# Patient Record
Sex: Male | Born: 1980 | Hispanic: No | Marital: Married | Smoking: Never smoker
Health system: Southern US, Community
[De-identification: ages and names within clinical notes are randomized; demographics above are authoritative.]

## PROBLEM LIST (undated history)

## (undated) DIAGNOSIS — I2510 Atherosclerotic heart disease of native coronary artery without angina pectoris: Secondary | ICD-10-CM

## (undated) DIAGNOSIS — I10 Essential (primary) hypertension: Secondary | ICD-10-CM

## (undated) DIAGNOSIS — R7303 Prediabetes: Secondary | ICD-10-CM

## (undated) DIAGNOSIS — L4050 Arthropathic psoriasis, unspecified: Secondary | ICD-10-CM

## (undated) HISTORY — DX: Atherosclerotic heart disease of native coronary artery without angina pectoris: I25.10

## (undated) HISTORY — DX: Essential (primary) hypertension: I10

## (undated) HISTORY — DX: Prediabetes: R73.03

## (undated) HISTORY — DX: Arthropathic psoriasis, unspecified: L40.50

---

## 2020-05-02 ENCOUNTER — Ambulatory Visit: Payer: BC Managed Care – PPO | Admitting: Internal Medicine

## 2020-05-16 NOTE — Progress Notes (Addendum)
Office Visit Note  Patient: Tyler Matthews             Date of Birth: 06-08-1980           MRN: [id number]             PCP: Brynda Greathouse, MD Referring: Jearld Adjutant * Visit Date: 05/29/2020 Occupation: @GUAROCC @  Subjective:  Pain in multiple joints and psoriatic arthritis.   History of Present Illness: Tyler Matthews is a 40 y.o. male seen in consultation per request of his previous rheumatologist Dr. 20 from [facility].  According to the patient in 2014 he developed psoriasis on his scalp which was later diagnosed with psoriasis by the dermatologist and was treated with topical agents.  He moved to [place] in 2015 where he developed pain and swelling in his left ankle in 2017 he was initially evaluated by orthopedic surgeon and a sports medicine doctor and then was referred to a rheumatologist.  He has been under care of rheumatologist since 2018.  He has been on methotrexate and folic acid combination.  He was on methotrexate 6 tablets/week and did quite well and the methotrexate dose was tapered to 4 tablets/week.  He stayed on the same dosage for a while.  In April 2021 he moved to [facility] and was getting his prescriptions from his previous rheumatologist.  As his rheumatologist moved he has not had any medication for the last 2 months.  He states he recently drove to [facility] [place] and came back and developed right ankle Achilles tendinitis.  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  He has some discomfort in his left wrist joint off-and-on.  None of the other joints are swollen.  There is no history of nail pitting.  There is family history of psoriasis in his cousin and paternal uncle.  There is also possible psoriatic arthritis in his paternal uncle.  He has 2 children who are in good health and they were born by IVF.  The couple is not using any contraception at  this time.  Activities of Daily Living:  Patient reports morning stiffness for 0 minutes.   Patient Denies nocturnal pain.  Difficulty dressing/grooming: Denies Difficulty climbing stairs: Denies Difficulty getting out of chair: Denies Difficulty using hands for taps, buttons, cutlery, and/or writing: Denies  Review of Systems  Constitutional: Negative for fatigue.  HENT: Negative for mouth sores, mouth dryness and nose dryness.   Eyes: Negative for pain, itching and dryness.  Respiratory: Negative for shortness of breath and difficulty breathing.   Cardiovascular: Negative for chest pain and palpitations.  Gastrointestinal: Negative for blood in stool, constipation and diarrhea.  Endocrine: Negative for increased urination.  Genitourinary: Negative for difficulty urinating.  Musculoskeletal: Positive for arthralgias, joint pain and joint swelling. Negative for myalgias, morning stiffness, muscle tenderness and myalgias.  Skin: Negative for color change, rash and redness.  Allergic/Immunologic: Negative for susceptible to infections.  Neurological: Negative for dizziness, numbness, headaches, memory loss and weakness.  Hematological: Negative for bruising/bleeding tendency.  Psychiatric/Behavioral: Negative for confusion.    PMFS History:  Patient Active Problem List   Diagnosis Date Noted  . Psoriatic arthritis (HCC) 05/29/2020  . Psoriasis 05/29/2020    History reviewed. No pertinent past medical history.  Family History  Problem Relation Age of Onset  . Hypertension Mother   . Diabetes Father   .  Parkinson's disease Father   . Healthy Brother   . Healthy Brother   . Hypertension Brother   . Healthy Son   . Healthy Son    History reviewed. No pertinent surgical history. Social History   Social History Narrative  . Not on file   Immunization History  Administered Date(s) Administered  . Moderna Sars-Covid-2 Vaccination 04/21/2019, 05/21/2019, 02/10/2020      Objective: Vital Signs: BP (!) 180/105 (BP Location: Right Arm, Patient Position: Sitting, Cuff Size: Normal)   Pulse 71   Resp 14   Ht 5\' 8"  (1.727 m)   Wt 170 lb (77.1 kg)   BMI 25.85 kg/m    Physical Exam Vitals and nursing note reviewed.  Constitutional:      Appearance: He is well-developed.  HENT:     Head: Normocephalic and atraumatic.  Eyes:     Conjunctiva/sclera: Conjunctivae normal.     Pupils: Pupils are equal, round, and reactive to light.  Cardiovascular:     Rate and Rhythm: Normal rate and regular rhythm.     Heart sounds: Normal heart sounds.  Pulmonary:     Effort: Pulmonary effort is normal.     Breath sounds: Normal breath sounds.  Abdominal:     General: Bowel sounds are normal.     Palpations: Abdomen is soft.  Musculoskeletal:     Cervical back: Normal range of motion and neck supple.  Skin:    General: Skin is warm and dry.     Capillary Refill: Capillary refill takes less than 2 seconds.  Neurological:     Mental Status: He is alert and oriented to person, place, and time.  Psychiatric:        Behavior: Behavior normal.      Musculoskeletal Exam: C-spine thoracic and lumbar spine were in good range of motion.  He had no SI joint tenderness.  Shoulder joints, elbow joints, wrist joints, MCPs PIPs and DIPs with good range of motion with no synovitis.  He has some tenderness over his left wrist joint.  Hip joints, knee joints are in good range of motion.  He has warmth and swelling over his left ankle joint.  He had right Achilles tendinitis.  There was no tenderness over plantar fascia.  There was no tenderness over MTPs PIPs or DIPs.  CDAI Exam: CDAI Score: -- Patient Global: --; Provider Global: -- Swollen: --; Tender: -- Joint Exam 05/29/2020   No joint exam has been documented for this visit   There is currently no information documented on the homunculus. Go to the Rheumatology activity and complete the homunculus joint  exam.  Investigation: No additional findings.  Imaging: DG Chest 2 View  Result Date: 05/29/2020 CLINICAL DATA:  Patient on immunosuppressive therapy. EXAM: CHEST - 2 VIEW COMPARISON:  None. FINDINGS: The cardiac silhouette, mediastinal and hilar contours are normal. The lungs are clear. No pleural effusions. The bony thorax is intact. IMPRESSION: Normal chest x-ray. Electronically Signed   By: 05/31/2020 M.D.   On: 05/29/2020 12:24   XR Foot 2 Views Left  Result Date: 05/29/2020 No MTP narrowing was noted.  Second PIP narrowing was noted.  No intertarsal, tibiotalar or subtalar joint space narrowing was noted.  Posterior calcaneal spur was noted.  No erosive changes were noted. Impression: These findings are consistent with early osteoarthritic changes and calcaneal spur.  XR Foot 2 Views Right  Result Date: 05/29/2020 No MCP, PIP or DIP narrowing was noted.  No intertarsal, tibiotalar or  subtalar joint space narrowing was noted.  Small inferior and posterior calcaneal spurs were noted.  No erosive changes were noted. Impression: Unremarkable x-ray of the foot except for calcaneal spurs.  XR Hand 2 View Left  Result Date: 05/29/2020 No CMC, MCP, PIP or DIP narrowing was noted.  No intercarpal or radiocarpal joint space narrowing was noted.  No erosive changes were noted. Impression: Unremarkable x-ray of the hand.  XR Hand 2 View Right  Result Date: 05/29/2020 No CMC, MCP, PIP or DIP narrowing was noted.  No intercarpal or radiocarpal joint space narrowing was noted.  No erosive changes were noted. Impression: Unremarkable x-ray of the hand.   Recent Labs: No results found for: WBC, HGB, PLT, NA, K, CL, CO2, GLUCOSE, BUN, CREATININE, BILITOT, ALKPHOS, AST, ALT, PROT, ALBUMIN, CALCIUM, GFRAA, QFTBGOLD, QFTBGOLDPLUS  Speciality Comments: No specialty comments available.  Procedures:  No procedures performed Allergies: Patient has no allergy information on record.   Assessment /  Plan:     Visit Diagnoses: Psoriatic arthritis (HCC) - Dx in [place], Dr. Trisha Mangle in 2018 -he has been treated with methotrexate 6 tablets p.o. weekly with folic acid.  The methotrexate was reduced to 4 tablets p.o. weekly before he moved to [place].  He discontinued methotrexate about 2 months ago as he could not get any further refills.  He has been having pain and discomfort in his left wrist, right Achilles tendon and left ankle joint.  We had detailed discussion regarding different treatment options.  He has done really well on methotrexate in the past and wants to go on the same medication.  Indications side effects contraindications were discussed at length.  My plan is to start him on methotrexate 6 tablets p.o. weekly along with folic acid 1 mg p.o. daily once the lab results are available.  Once he starts methotrexate he will get labs in 2 weeks and then every 3 months to monitor for drug toxicity.  Plan: Sedimentation rate  Drug Counseling TB Gold: Pending Hepatitis panel: Pending  Chest-xray: Pending  Contraception: None.  History of infertility.  He had 2 children with IVF.  Use of contraception was discussed at length.  Alcohol use: Occasional beer  Patient was counseled on the purpose, proper use, and adverse effects of methotrexate including nausea, infection, and signs and symptoms of pneumonitis.  Reviewed instructions with patient to take methotrexate weekly along with folic acid daily.  Discussed the importance of frequent monitoring of kidney and liver function and blood counts, and provided patient with standing lab instructions.  Counseled patient to avoid NSAIDs and alcohol while on methotrexate.  Provided patient with educational materials on methotrexate and answered all questions.  Advised patient to get annual influenza vaccine and to get a pneumococcal vaccine if patient has not already had one.  Patient voiced understanding.  Patient consented to methotrexate use.  Will upload  into chart.    High risk medication use - MTX 15 mg weekly and folic acid 1 mg daily. insurance denied otezla in 2018 - Plan: CBC with Differential/Platelet, COMPLETE METABOLIC PANEL WITH GFR, Hepatitis B core antibody, IgM, Hepatitis B surface antigen, Hepatitis C antibody, HIV Antibody (routine testing w rflx), QuantiFERON-TB Gold Plus, Serum protein electrophoresis with reflex, IgG, IgA, IgM, DG Chest 2 View  Psoriasis - Dx in 2016 he states that psoriasis is limited to his scalp and responds to the topical agents.  He is currently does not have any active lesions.  Pain in both hands -he has  off-and-on discomfort in his hands.  He had tenderness on palpation over left wrist.  Plan: XR Hand 2 View Right, XR Hand 2 View Left.  X-ray of bilateral hands were unremarkable.  X-ray findings were discussed with the patient.  Pain in both feet -get swelling of his left ankle joint.  He had right Achilles tendinitis.  Plan: XR Foot 2 Views Right, XR Foot 2 Views Left, calcaneal spurs were noted.  X-ray findings were discussed with the patient.  Uric acid, Rheumatoid factor, Cyclic citrul peptide antibody, IgG  Elevated blood pressure reading-patient states that he was treated with metoprolol in the past by his PCP but since he has moved here he does not have a PCP.  He requested a referral to a PCP.  As his blood pressure reading was high today I will place him on amlodipine 5 mg p.o. daily.  Indications, side effects were discussed.  I also advised him to get his blood pressure monitored on a regular basis and keep some readings until his appointment with the PCP.  De Quervain's tenosynovitis, right-he developed symptoms 1 year ago and had complete resolution after cortisone injection by his rheumatologist.  Family history of psoriasis-paternal uncle and first cousin.  His paternal uncle also has psoriatic arthritis.   He is fully vaccinated against COVID-19.  Instructions regarding future booster was  placed.  Instructions regarding other vaccines was also placed.  Increased risk of heart disease with psoriatic arthritis was discussed and the instructions were placed in the AVS.  Orders: Orders Placed This Encounter  Procedures  . XR Hand 2 View Right  . XR Hand 2 View Left  . XR Foot 2 Views Right  . XR Foot 2 Views Left  . DG Chest 2 View  . CBC with Differential/Platelet  . COMPLETE METABOLIC PANEL WITH GFR  . Sedimentation rate  . Uric acid  . Rheumatoid factor  . Cyclic citrul peptide antibody, IgG  . Hepatitis B core antibody, IgM  . Hepatitis B surface antigen  . Hepatitis C antibody  . HIV Antibody (routine testing w rflx)  . QuantiFERON-TB Gold Plus  . Serum protein electrophoresis with reflex  . IgG, IgA, IgM  . [facility]   No orders of the defined types were placed in this encounter.     Follow-Up Instructions: Return for Psoriatic arthritis.   Pollyann SavoyShaili Dhruti Ghuman, MD  Note - This record has been created using Animal [employer]istDragon software.  Chart creation errors have been sought, but may not always  have been located. Such creation errors do not reflect on  the standard of medical care.

## 2020-05-29 ENCOUNTER — Other Ambulatory Visit: Payer: Self-pay

## 2020-05-29 ENCOUNTER — Ambulatory Visit (INDEPENDENT_AMBULATORY_CARE_PROVIDER_SITE_OTHER): Payer: BC Managed Care – PPO | Admitting: Rheumatology

## 2020-05-29 ENCOUNTER — Ambulatory Visit: Payer: Self-pay

## 2020-05-29 ENCOUNTER — Encounter: Payer: Self-pay | Admitting: Rheumatology

## 2020-05-29 ENCOUNTER — Ambulatory Visit (HOSPITAL_COMMUNITY)
Admission: RE | Admit: 2020-05-29 | Discharge: 2020-05-29 | Disposition: A | Discharge status: Home / Self Care | Payer: BC Managed Care – PPO | Source: Ambulatory Visit | Attending: Rheumatology | Admitting: Rheumatology

## 2020-05-29 ENCOUNTER — Telehealth: Payer: Self-pay

## 2020-05-29 VITALS — BP 180/105 | HR 71 | Resp 14 | Ht 68.0 in | Wt 170.0 lb

## 2020-05-29 DIAGNOSIS — L409 Psoriasis, unspecified: Secondary | ICD-10-CM | POA: Diagnosis not present

## 2020-05-29 DIAGNOSIS — M79672 Pain in left foot: Secondary | ICD-10-CM | POA: Diagnosis not present

## 2020-05-29 DIAGNOSIS — M79671 Pain in right foot: Secondary | ICD-10-CM

## 2020-05-29 DIAGNOSIS — Z79899 Other long term (current) drug therapy: Secondary | ICD-10-CM | POA: Diagnosis present

## 2020-05-29 DIAGNOSIS — M79642 Pain in left hand: Secondary | ICD-10-CM

## 2020-05-29 DIAGNOSIS — Z872 Personal history of diseases of the skin and subcutaneous tissue: Secondary | ICD-10-CM

## 2020-05-29 DIAGNOSIS — M79641 Pain in right hand: Secondary | ICD-10-CM | POA: Diagnosis not present

## 2020-05-29 DIAGNOSIS — M654 Radial styloid tenosynovitis [de Quervain]: Secondary | ICD-10-CM

## 2020-05-29 DIAGNOSIS — L4050 Arthropathic psoriasis, unspecified: Secondary | ICD-10-CM

## 2020-05-29 DIAGNOSIS — R030 Elevated blood-pressure reading, without diagnosis of hypertension: Secondary | ICD-10-CM

## 2020-05-29 MED ORDER — AMLODIPINE BESYLATE 5 MG PO TABS: 5.0000 mg | ORAL_TABLET | Freq: Every day | ORAL | 0 refills | Status: DC

## 2020-05-29 NOTE — Telephone Encounter (Signed)
Methotrexate and folic acid prescriptions pending lab results, per Dr. Corliss Skains. Thanks!   Consent obtained and sent to the scan center.

## 2020-05-29 NOTE — Addendum Note (Signed)
Addended by: Alysia Penna C on: 05/29/2020 01:43 PM   Modules accepted: Orders

## 2020-05-29 NOTE — Patient Instructions (Signed)
Standing Labs We placed an order today for your standing lab work.   Please have your standing labs drawn in 2 weeks after starting methotrexate and then every 3 months  If possible, please have your labs drawn 2 weeks prior to your appointment so that the provider can discuss your results at your appointment.  We have open lab daily Monday through Thursday from 1:30-4:30 PM and Friday from 1:30-4:00 PM at the office of Dr. Pollyann Savoy, [facility].   Please be advised, all patients with office appointments requiring lab work will take precedents over walk-in lab work.  If possible, please come for your lab work on Monday and Friday afternoons, as you may experience shorter wait times. The office is located at [address], [place], [place] [address] No appointment is necessary.   Labs are drawn by [facility]. Please bring your co-pay at the time of your lab draw.  You may receive a bill from [facility] for your lab work.  If you wish to have your labs drawn at another location, please call the office 24 hours in advance to send orders.  If you have any questions regarding directions or hours of operation,  please call [phone].   As a reminder, please drink plenty of water prior to coming for your lab work. Thanks!     Methotrexate tablets What is this medicine? METHOTREXATE (METH oh TREX ate) is a chemotherapy drug used to treat cancer including breast cancer, leukemia, and lymphoma. This medicine can also be used to treat psoriasis and certain kinds of arthritis. This medicine may be used for other purposes; ask your health care provider or pharmacist if you have questions. COMMON BRAND NAME(S): Rheumatrex, Trexall What should I tell my health care provider before I take this medicine? They need to know if you have any of these conditions:  fluid in the stomach area or lungs  if you often drink alcohol  infection or immune system problems  kidney  disease or on hemodialysis  liver disease  low blood counts, like low white cell, platelet, or red cell counts  lung disease  radiation therapy  stomach ulcers  ulcerative colitis  an unusual or allergic reaction to methotrexate, other medicines, foods, dyes, or preservatives  pregnant or trying to get pregnant  breast-feeding How should I use this medicine? Take this medicine by mouth with a glass of water. Follow the directions on the prescription label. Take your medicine at regular intervals. Do not take it more often than directed. Do not stop taking except on your doctor's advice. Make sure you know why you are taking this medicine and how often you should take it. If this medicine is used for a condition that is not cancer, like arthritis or psoriasis, it should be taken weekly, NOT daily. Taking this medicine more often than directed can cause serious side effects, even death. Talk to your healthcare provider about safe handling and disposal of this medicine. You may need to take special precautions. Talk to your pediatrician regarding the use of this medicine in children. While this drug may be prescribed for selected conditions, precautions do apply. Overdosage: If you think you have taken too much of this medicine contact a poison control center or emergency room at once. NOTE: This medicine is only for you. Do not share this medicine with others. What if I miss a dose? If you miss a dose, talk with your doctor or health care professional. Do not take double  or extra doses. What may interact with this medicine? Do not take this medicine with any of the following medications:  acitretin This medicine may also interact with the following medication:  aspirin and aspirin-like medicines including salicylates  azathioprine  certain antibiotics like penicillins, tetracycline, and chloramphenicol  certain medicines that treat or prevent blood clots like warfarin, apixaban,  dabigatran, and rivaroxaban  certain medicines for stomach problems like esomeprazole, omeprazole, pantoprazole  cyclosporine  dapsone  diuretics  gold  hydroxychloroquine  live virus vaccines  medicines for infection like acyclovir, adefovir, amphotericin B, bacitracin, cidofovir, foscarnet, ganciclovir, gentamicin, pentamidine, vancomycin  mercaptopurine  NSAIDs, medicines for pain and inflammation, like ibuprofen or naproxen  other cytotoxic agents  pamidronate  pemetrexed  penicillamine  phenylbutazone  phenytoin  probenecid  pyrimethamine  retinoids such as isotretinoin and tretinoin  steroid medicines like prednisone or cortisone  sulfonamides like sulfasalazine and trimethoprim/sulfamethoxazole  theophylline  zoledronic acid This list may not describe all possible interactions. Give your health care provider a list of all the medicines, herbs, non-prescription drugs, or dietary supplements you use. Also tell them if you smoke, drink alcohol, or use illegal drugs. Some items may interact with your medicine. What should I watch for while using this medicine? Avoid alcoholic drinks. This medicine can make you more sensitive to the sun. Keep out of the sun. If you cannot avoid being in the sun, wear protective clothing and use sunscreen. Do not use sun lamps or tanning beds/booths. You may need blood work done while you are taking this medicine. Call your doctor or health care professional for advice if you get a fever, chills or sore throat, or other symptoms of a cold or flu. Do not treat yourself. This drug decreases your body's ability to fight infections. Try to avoid being around people who are sick. This medicine may increase your risk to bruise or bleed. Call your doctor or health care professional if you notice any unusual bleeding. Be careful brushing or flossing your teeth or using a toothpick because you may get an infection or bleed more easily.  If you have any dental work done, tell your dentist you are receiving this medicine. Check with your doctor or health care professional if you get an attack of severe diarrhea, nausea and vomiting, or if you sweat a lot. The loss of too much body fluid can make it dangerous for you to take this medicine. Talk to your doctor about your risk of cancer. You may be more at risk for certain types of cancers if you take this medicine. Do not become pregnant while taking this medicine or for 6 months after stopping it. Women should inform their health care provider if they wish to become pregnant or think they might be pregnant. Men should not father a child while taking this medicine and for 3 months after stopping it. There is potential for serious harm to an unborn child. Talk to your health care provider for more information. Do not breast-feed an infant while taking this medicine or for 1 week after stopping it. This medicine may make it more difficult to get pregnant or father a child. Talk to your health care provider if you are concerned about your fertility. What side effects may I notice from receiving this medicine? Side effects that you should report to your doctor or health care professional as soon as possible:  allergic reactions like skin rash, itching or hives, swelling of the face, lips, or tongue  breathing  problems or shortness of breath  diarrhea  dry, nonproductive cough  low blood counts - this medicine may decrease the number of white blood cells, red blood cells and platelets. You may be at increased risk for infections and bleeding.  mouth sores  redness, blistering, peeling or loosening of the skin, including inside the mouth  signs of infection - fever or chills, cough, sore throat, pain or trouble passing urine  signs and symptoms of bleeding such as bloody or black, tarry stools; red or dark-brown urine; spitting up blood or brown material that looks like coffee  grounds; red spots on the skin; unusual bruising or bleeding from the eye, gums, or nose  signs and symptoms of kidney injury like trouble passing urine or change in the amount of urine  signs and symptoms of liver injury like dark yellow or brown urine; general ill feeling or flu-like symptoms; light-colored stools; loss of appetite; nausea; right upper belly pain; unusually weak or tired; yellowing of the eyes or skin Side effects that usually do not require medical attention (report to your doctor or health care professional if they continue or are bothersome):  dizziness  hair loss  tiredness  upset stomach  vomiting This list may not describe all possible side effects. Call your doctor for medical advice about side effects. You may report side effects to FDA at 1-800-FDA-1088. Where should I keep my medicine? Keep out of the reach of children and pets. Store at room temperature between 20 and 25 degrees C (68 and 77 degrees F). Protect from light. Get rid of any unused medicine after the expiration date. Talk to your health care provider about how to dispose of unused medicine. Special directions may apply. NOTE: This sheet is a summary. It may not cover all possible information. If you have questions about this medicine, talk to your doctor, pharmacist, or health care provider.  2021 Elsevier/Gold Standard (2019-08-30 10:40:39)  COVID-19 vaccine recommendations:   COVID-19 vaccine is recommended for everyone (unless you are allergic to a vaccine component), even if you are on a medication that suppresses your immune system.   If you are on Methotrexate, Cellcept (mycophenolate), Rinvoq, Harriette OharaXeljanz, and Olumiant- hold the medication for 1 week after each vaccine. Hold Methotrexate for 2 weeks after the single dose COVID-19 vaccine.   The recommendations are that the individuals on immunosuppressive therapy should receive first 3 COVID-19 vaccination 1 month apart and then a fourth  dose (booster) 3 months after the third dose  Do not take Tylenol or any anti-inflammatory medications (NSAIDs) 24 hours prior to the COVID-19 vaccination.   There is no direct evidence about the efficacy of the COVID-19 vaccine in individuals who are on medications that suppress the immune system.   Even if you are fully vaccinated, and you are on any medications that suppress your immune system, please continue to wear a mask, maintain at least six feet social distance and practice hand hygiene.   If you develop a COVID-19 infection, please contact your PCP or our office to determine if you need monoclonal antibody infusion.  The booster vaccine is now available for immunocompromised patients.   Please see the following web sites for updated information.   [email or web address]  Vaccines You are taking a medication(s) that can suppress your immune system.  The following immunizations are recommended: . Flu annually . Covid-19  . Pneumonia (Pneumovax 23 and Prevnar 13 spaced at least 1 year apart) . Shingrix (after age 39)  Please check  with your PCP to make sure you are up to date.   Heart Disease Prevention   Your inflammatory disease increases your risk of heart disease which includes heart attack, stroke, atrial fibrillation (irregular heartbeats), high blood pressure, heart failure and atherosclerosis (plaque in the arteries).  It is important to reduce your risk by:   . Keep blood pressure, cholesterol, and blood sugar at healthy levels   . Smoking Cessation   . Maintain a healthy weight  o BMI 20-25   . Eat a healthy diet  o Plenty of fresh fruit, vegetables, and whole grains  o Limit saturated fats, foods high in sodium, and added sugars  o DASH and Mediterranean diet   . Increase physical activity  o Recommend moderate physically activity for 150 minutes per week/ 30 minutes a day for five days a  week These can be broken up into three separate ten-minute sessions during the day.   . Reduce Stress  . Meditation, slow breathing exercises, yoga, coloring books  . Dental visits twice a year

## 2020-05-29 NOTE — Progress Notes (Signed)
Amlodipine prescription pended per your telephone conversation with the patient. Please review and sign prescription. Thanks!

## 2020-05-29 NOTE — Progress Notes (Signed)
Pharmacy Note  Subjective: Patient presents today to [facility] for initial office visit with Dr. Corliss Skains. Patient seen by the pharmacist for counseling on methotrexate for psoriatic arthritis.  He was previously on methotrexate 10 mg daily but last taken 2 months ago. Gap in therapy attributed to needing to establish care with new provider. States he had a mouth sore (no bleeding) last week that resolved within 2 days, however didn't have mouth sores while taking MTX.  Objective: CBC/CMP/baseline immunosuppressant labs to be drawn tomorrow  Chest-xray:  To be completed today after OV at [facility]  Contraception: patient not currently using but will plan to use  Alcohol use: drinks 1 beer weekly but will reduce  Assessment/Plan:   Patient was counseled on the purpose, proper use, and adverse effects of methotrexate including nausea, infection, and signs and symptoms of pneumonitis. Discussed that there is the possibility of an increased risk of malignancy, specifically lymphomas, but it is not well understood if this increased risk is due to the medication or the disease state.  Instructed patient that medication should be held for infection and prior to surgery.  Advised patient to avoid live vaccines. Recommend annual influenza, Pneumovax 23, Prevnar 13, and Shingrix as indicated.  Has received 3 COVID19 vaccines.  Reviewed instructions with patient to take methotrexate weekly along with folic acid daily.  Discussed the importance of frequent monitoring of kidney and liver function and blood counts, and provided patient with standing lab instructions.  Counseled patient to avoid alcohol while on methotrexate d/t black box warning for hepatotoxicity.  Provided patient with educational materials on methotrexate and answered all questions.   Patient voiced understanding.  Patient consented to methotrexate use.  Will upload into chart.    Dose of methotrexate will be 15mg  weekly  along with folic acid 2mg  daily. Prescription pending labs and baseline chest x-ray.  Labs to be re-drawn 2 weeks after starting then every 3 months.   , PharmD, MPH Clinical Pharmacist (Rheumatology and Pulmonology)

## 2020-06-01 LAB — IGG, IGA, IGM
IgG (Immunoglobin G), Serum: 1601 mg/dL (ref 600–1640)
IgM, Serum: 75 mg/dL (ref 50–300)
Immunoglobulin A: 330 mg/dL — ABNORMAL HIGH (ref 47–310)

## 2020-06-01 LAB — CBC WITH DIFFERENTIAL/PLATELET
Absolute Monocytes: 614 cells/uL (ref 200–950)
Basophils Absolute: 89 cells/uL (ref 0–200)
Basophils Relative: 1.2 %
Eosinophils Absolute: 259 cells/uL (ref 15–500)
Eosinophils Relative: 3.5 %
HCT: 46.5 % (ref 38.5–50.0)
Hemoglobin: 15.2 g/dL (ref 13.2–17.1)
Lymphs Abs: 1909 cells/uL (ref 850–3900)
MCH: 28.1 pg (ref 27.0–33.0)
MCHC: 32.7 g/dL (ref 32.0–36.0)
MCV: 86.1 fL (ref 80.0–100.0)
MPV: 9.7 fL (ref 7.5–12.5)
Monocytes Relative: 8.3 %
Neutro Abs: 4529 cells/uL (ref 1500–7800)
Neutrophils Relative %: 61.2 %
Platelets: 359 10*3/uL (ref 140–400)
RBC: 5.4 10*6/uL (ref 4.20–5.80)
RDW: 12 % (ref 11.0–15.0)
Total Lymphocyte: 25.8 %
WBC: 7.4 10*3/uL (ref 3.8–10.8)

## 2020-06-01 LAB — QUANTIFERON-TB GOLD PLUS
Mitogen-NIL: >10 IU/mL
NIL: 0.04 IU/mL
QuantiFERON-TB Gold Plus: NEGATIVE
TB1-NIL: <0 IU/mL
TB2-NIL: <0 IU/mL

## 2020-06-01 LAB — COMPLETE METABOLIC PANEL WITH GFR
AG Ratio: 1.4 (calc) (ref 1.0–2.5)
ALT: 24 U/L (ref 9–46)
AST: 18 U/L (ref 10–40)
Albumin: 4.5 g/dL (ref 3.6–5.1)
Alkaline phosphatase (APISO): 85 U/L (ref 36–130)
BUN: 10 mg/dL (ref 7–25)
CO2: 27 mmol/L (ref 20–32)
Calcium: 9.6 mg/dL (ref 8.6–10.3)
Chloride: 102 mmol/L (ref 98–110)
Creat: 0.86 mg/dL (ref 0.60–1.35)
GFR, Est African American: 127 mL/min/{1.73_m2} (ref >=60)
GFR, Est Non African American: 110 mL/min/{1.73_m2} (ref >=60)
Globulin: 3.3 g/dL (calc) (ref 1.9–3.7)
Glucose, Bld: 76 mg/dL (ref 65–99)
Potassium: 4.2 mmol/L (ref 3.5–5.3)
Sodium: 139 mmol/L (ref 135–146)
Total Bilirubin: 0.4 mg/dL (ref 0.2–1.2)
Total Protein: 7.8 g/dL (ref 6.1–8.1)

## 2020-06-01 LAB — PROTEIN ELECTROPHORESIS, SERUM, WITH REFLEX
Albumin ELP: 4.4 g/dL (ref 3.8–4.8)
Alpha 1: 0.3 g/dL (ref 0.2–0.3)
Alpha 2: 0.6 g/dL (ref 0.5–0.9)
Beta 2: 0.5 g/dL (ref 0.2–0.5)
Beta Globulin: 0.5 g/dL (ref 0.4–0.6)
Gamma Globulin: 1.4 g/dL (ref 0.8–1.7)
Total Protein: 7.7 g/dL (ref 6.1–8.1)

## 2020-06-01 LAB — HEPATITIS B SURFACE ANTIGEN: Hepatitis B Surface Ag: NONREACTIVE

## 2020-06-01 LAB — URIC ACID: Uric Acid, Serum: 5.4 mg/dL (ref 4.0–8.0)

## 2020-06-01 LAB — RHEUMATOID FACTOR: Rheumatoid fact SerPl-aCnc: 19 IU/mL — ABNORMAL HIGH (ref <14)

## 2020-06-01 LAB — HEPATITIS C ANTIBODY
Hepatitis C Ab: NONREACTIVE
SIGNAL TO CUT-OFF: 0.01 (ref <1.00)

## 2020-06-01 LAB — SEDIMENTATION RATE: Sed Rate: 2 mm/h (ref 0–15)

## 2020-06-01 LAB — HEPATITIS B CORE ANTIBODY, IGM: Hep B C IgM: NONREACTIVE

## 2020-06-01 LAB — CYCLIC CITRUL PEPTIDE ANTIBODY, IGG: Cyclic Citrullin Peptide Ab: <16 UNITS

## 2020-06-01 LAB — HIV ANTIBODY (ROUTINE TESTING W REFLEX): HIV 1&2 Ab, 4th Generation: NONREACTIVE

## 2020-06-02 ENCOUNTER — Other Ambulatory Visit: Payer: Self-pay | Admitting: *Deleted

## 2020-06-02 MED ORDER — METHOTREXATE 2.5 MG PO TABS: 15.0000 mg | ORAL_TABLET | ORAL | 0 refills | Status: DC

## 2020-06-02 MED ORDER — FOLIC ACID 1 MG PO TABS: 2.0000 mg | ORAL_TABLET | Freq: Every day | ORAL | 3 refills | Status: DC

## 2020-06-02 NOTE — Telephone Encounter (Signed)
Please see the message with the lab results regarding the prescription.

## 2020-06-02 NOTE — Progress Notes (Signed)
All the lab results are available now.  It is okay to call in prescription for methotrexate 6 tablets p.o. weekly along with folic acid 2 mg p.o. daily.  He will get labs in 2 weeks and then if labs are stable then the methotrexate dose will be increased to 8 tablets p.o. weekly.  Please notify patient that I decided not to call in a prescription for prednisone as his blood pressure was very high.  I discussed all the lab results with the patient.

## 2020-06-02 NOTE — Telephone Encounter (Signed)
Please review labs, patient is having pain, please send RX to [facility] [place], [place] for this RX, next time send RX to [facility]

## 2020-06-13 NOTE — Progress Notes (Signed)
Office Visit Note  Patient: Tyler Matthews             Date of Birth: 10-25-1980           MRN: [id number]             PCP: Richardean Canal, MD Referring: Pollie Meyer * Visit Date: 06/27/2020 Occupation: @GUAROCC @  Subjective:  Medication management.   History of Present Illness: Tyler Matthews is a 40 y.o. male with a history of psoriatic arthritis and psoriasis.  He was started on methotrexate about a month ago.  He has been taking methotrexate 6 tablets p.o. weekly.  He was also given a prednisone taper due to pain and swelling in his right ankle joint.  He has noted improvement with prednisone.  He states if he does not take prednisone he still has pain and discomfort in his right ankle to the point that he is limping and has difficulty walking.  He describes pain over the right Achilles tendon.  None of the other joints are painful.  Activities of Daily Living:  Patient reports morning stiffness for 0 minutes.   Patient Denies nocturnal pain.  Difficulty dressing/grooming: Denies Difficulty climbing stairs: Denies Difficulty getting out of chair: Denies Difficulty using hands for taps, buttons, cutlery, and/or writing: Denies  Review of Systems  Constitutional: Negative for fatigue.  HENT: Negative for mouth sores, mouth dryness and nose dryness.   Eyes: Negative for pain, itching and dryness.  Respiratory: Negative for shortness of breath and difficulty breathing.   Cardiovascular: Negative for chest pain and palpitations.  Gastrointestinal: Negative for blood in stool, constipation and diarrhea.  Endocrine: Negative for increased urination.  Genitourinary: Negative for difficulty urinating.  Musculoskeletal: Positive for arthralgias and joint pain. Negative for joint swelling, myalgias, morning stiffness, muscle tenderness and myalgias.  Skin: Negative for color change, rash and redness.  Allergic/Immunologic: Negative for susceptible to infections.   Neurological: Negative for dizziness, numbness, headaches, memory loss and weakness.  Hematological: Negative for bruising/bleeding tendency.  Psychiatric/Behavioral: Negative for confusion.    PMFS History:  Patient Active Problem List   Diagnosis Date Noted  . Family history of psoriasis 06/14/2020  . Psoriatic arthritis ([place]) 05/29/2020  . Psoriasis 05/29/2020    History reviewed. No pertinent past medical history.  Family History  Problem Relation Age of Onset  . Hypertension Mother   . Diabetes Father   . Parkinson's disease Father   . Healthy Brother   . Healthy Brother   . Hypertension Brother   . Healthy Son   . Healthy Son    History reviewed. No pertinent surgical history. Social History   Social History Narrative  . Not on file   Immunization History  Administered Date(s) Administered  . Moderna Sars-Covid-2 Vaccination 04/21/2019, 05/21/2019, 02/10/2020     Objective: Vital Signs: BP (!) 147/95 (BP Location: Left Arm, Patient Position: Sitting, Cuff Size: Normal)   Pulse 80   Resp 15   Ht 5\' 10"  (1.778 m)   Wt 174 lb 9.6 oz (79.2 kg)   BMI 25.05 kg/m    Physical Exam Vitals and nursing note reviewed.  Constitutional:      Appearance: He is well-developed.  HENT:     Head: Normocephalic and atraumatic.  Eyes:     Conjunctiva/sclera: Conjunctivae normal.     Pupils: Pupils are equal, round, and reactive to light.  Cardiovascular:     Rate and Rhythm: Normal rate and regular rhythm.     Heart  sounds: Normal heart sounds.  Pulmonary:     Effort: Pulmonary effort is normal.     Breath sounds: Normal breath sounds.  Abdominal:     General: Bowel sounds are normal.     Palpations: Abdomen is soft.  Musculoskeletal:     Cervical back: Normal range of motion and neck supple.  Skin:    General: Skin is warm and dry.     Capillary Refill: Capillary refill takes less than 2 seconds.  Neurological:     Mental Status: He is alert and oriented to  person, place, and time.  Psychiatric:        Behavior: Behavior normal.      Musculoskeletal Exam: C-spine was in good range of motion.  Thoracic and lumbar spine with good range of motion.  He had no SI joint tenderness.  Shoulder joints, elbow joints, wrist joints, MCPs PIPs and DIPs with good range of motion with no synovitis.  Hip joints and knee joints with good range of motion.  He had no tenderness over ankles or MTPs.  He had tenderness over his right Achilles tendon.  CDAI Exam: CDAI Score: -- Patient Global: --; Provider Global: -- Swollen: --; Tender: -- Joint Exam 06/27/2020   No joint exam has been documented for this visit   There is currently no information documented on the homunculus. Go to the Rheumatology activity and complete the homunculus joint exam.  Investigation: No additional findings.  Imaging: DG Chest 2 View  Result Date: 05/29/2020 CLINICAL DATA:  Patient on immunosuppressive therapy. EXAM: CHEST - 2 VIEW COMPARISON:  None. FINDINGS: The cardiac silhouette, mediastinal and hilar contours are normal. The lungs are clear. No pleural effusions. The bony thorax is intact. IMPRESSION: Normal chest x-ray. Electronically Signed   By: Marijo Sanes M.D.   On: 05/29/2020 12:24   XR Foot 2 Views Left  Result Date: 05/29/2020 No MTP narrowing was noted.  Second PIP narrowing was noted.  No intertarsal, tibiotalar or subtalar joint space narrowing was noted.  Posterior calcaneal spur was noted.  No erosive changes were noted. Impression: These findings are consistent with early osteoarthritic changes and calcaneal spur.  XR Foot 2 Views Right  Result Date: 05/29/2020 No MCP, PIP or DIP narrowing was noted.  No intertarsal, tibiotalar or subtalar joint space narrowing was noted.  Small inferior and posterior calcaneal spurs were noted.  No erosive changes were noted. Impression: Unremarkable x-ray of the foot except for calcaneal spurs.  XR Hand 2 View  Left  Result Date: 05/29/2020 No CMC, MCP, PIP or DIP narrowing was noted.  No intercarpal or radiocarpal joint space narrowing was noted.  No erosive changes were noted. Impression: Unremarkable x-ray of the hand.  XR Hand 2 View Right  Result Date: 05/29/2020 No CMC, MCP, PIP or DIP narrowing was noted.  No intercarpal or radiocarpal joint space narrowing was noted.  No erosive changes were noted. Impression: Unremarkable x-ray of the hand.   Recent Labs: Lab Results  Component Value Date   WBC 7.4 05/30/2020   HGB 15.2 05/30/2020   PLT 359 05/30/2020   NA 139 05/30/2020   K 4.2 05/30/2020   CL 102 05/30/2020   CO2 27 05/30/2020   GLUCOSE 76 05/30/2020   BUN 10 05/30/2020   CREATININE 0.86 05/30/2020   BILITOT 0.4 05/30/2020   AST 18 05/30/2020   ALT 24 05/30/2020   PROT 7.8 05/30/2020   PROT 7.7 05/30/2020   CALCIUM 9.6 05/30/2020  GFRAA 127 05/30/2020   QFTBGOLDPLUS NEGATIVE 05/30/2020   May 30, 2020 SPEP normal, immunoglobulins normal (IgM mildly elevated), TB Gold negative, hepatitis B-, hepatitis C negative, HIV negative, RF 19, anti-CCP negative, uric acid 5.4, ESR 2  Speciality Comments: Dxd in [place], Started MTX 6/wk then decresed to 4/wk. dcd JME26/83 restarted  Procedures:  No procedures performed Allergies: Patient has no allergy information on record.   Assessment / Plan:     Visit Diagnoses: Psoriatic arthritis ([place]) - Dx in [place], in 2018 -history of inflammatory arthritis, Achilles tendinitis, plantar fasciitis.  He is doing better since he has been on methotrexate.  He is a still on prednisone taper taking prednisone 5 mg p.o. daily.  He states he is unable to function without prednisone.  Long-term side effects of prednisone use were discussed.  I would like to taper him off the prednisone.  We discussed staying on prednisone 5 mg p.o. daily for 2 weeks and then 2.5 mg p.o. daily.  I will also increase methotrexate to 8 tablets p.o. weekly.  I  detailed discussion regarding switching to subcu methotrexate but he is hesitant.  He may need more aggressive therapy for the treatment of psoriatic arthritis as he still has ongoing symptoms and prednisone will not be a good long-term solution.  Psoriasis - Diagnosed in 2016.  Psoriasis is limited to the scalp and responds to topical agents.  He denies having any current lesions.  High risk medication use - methotrexate 6 tablets p.o. weekly with folic acid started 4196.  [place] methotrexate February 2022. - Plan: CBC with Differential/Platelet, COMPLETE METABOLIC PANEL WITH GFR today, 2 weeks  and then every 3 months to monitor for drug toxicities  Pain in both hands - He had tenderness on palpation over left wrist at the last visit which is resolved now.  X-rays were unremarkable.  The findings were discussed with the patient.  Pain in both feet - Left ankle swelling was noted at the last visit.  Left ankle swelling has completely resolved now.  He also had right Achilles tendinitis.  He still has some tenderness over right Achilles tendon.  X-rays were unremarkable.  The findings were discussed with the patient.  Family history of psoriasis - Paternal uncle and first cousin.  History of psoriatic arthritis in paternal uncle.  Elevated blood pressure reading - He was placed on amlodipine 5 mg p.o. daily at the last visit and was advised to establish with a PCP.  He has appointment with the PCP tomorrow.  He states his blood pressure readings are better in 130s and 80s at home.  Orders: Orders Placed This Encounter  Procedures  . CBC with Differential/Platelet  . COMPLETE METABOLIC PANEL WITH GFR   Meds ordered this encounter  Medications  . methotrexate (RHEUMATREX) 2.5 MG tablet    Sig: Take 8 tablets by mouth once weekly. Caution:Chemotherapy. Protect from light.    Dispense:  96 tablet    Refill:  0  . predniSONE (DELTASONE) 5 MG tablet    Sig: Take $RemoveB'5mg'pIGhRyOW$  by mouth daily for 2 weeks then  take 2.$Remov'5mg'wpeXnS$  by mouth daily for 2 weeks, then discontinue.    Dispense:  21 tablet    Refill:  0     Follow-Up Instructions: Return in about 2 months (around 08/27/2020) for Psoriatic arthritis.   Bo Merino, MD  Note - This record has been created using Editor, commissioning.  Chart creation errors have been sought, but may not always  have been located. Such creation errors do not reflect on  the standard of medical care.

## 2020-06-14 ENCOUNTER — Other Ambulatory Visit: Payer: Self-pay

## 2020-06-14 DIAGNOSIS — Z840 Family history of diseases of the skin and subcutaneous tissue: Secondary | ICD-10-CM | POA: Insufficient documentation

## 2020-06-14 MED ORDER — PREDNISONE 5 MG PO TABS: ORAL_TABLET | ORAL | 0 refills | Status: DC

## 2020-06-14 NOTE — Telephone Encounter (Signed)
Patient called stating he is still experiencing pain in his right leg and ankle.  Patient states Dr. Corliss Skains didn't want to prescribe Prednisone because his blood pressure was to high.  Patient states he is taking blood pressure medication and is hoping Dr. Corliss Skains will reconsider prescribing the Prednisone because he is still in a lot of pain.

## 2020-06-14 NOTE — Addendum Note (Signed)
Addended by: Audrie Lia on: 06/14/2020 04:14 PM   Modules accepted: Orders

## 2020-06-14 NOTE — Telephone Encounter (Signed)
LMOM for patient to contact PCP regarding BP

## 2020-06-14 NOTE — Telephone Encounter (Signed)
Okay to give prednisone taper.  Prednisone 5 mg tablet, starting at 20 mg p.o. daily and taper by 5 mg every 4 days.  He should follow-up with his PCP closely regarding control of his blood pressure.

## 2020-06-14 NOTE — Telephone Encounter (Addendum)
BP 140/95 last week or consider injection

## 2020-06-23 ENCOUNTER — Other Ambulatory Visit: Payer: Self-pay | Admitting: Rheumatology

## 2020-06-23 ENCOUNTER — Other Ambulatory Visit: Payer: Self-pay | Admitting: *Deleted

## 2020-06-23 DIAGNOSIS — L409 Psoriasis, unspecified: Secondary | ICD-10-CM

## 2020-06-23 DIAGNOSIS — Z79899 Other long term (current) drug therapy: Secondary | ICD-10-CM

## 2020-06-23 DIAGNOSIS — L4050 Arthropathic psoriasis, unspecified: Secondary | ICD-10-CM

## 2020-06-27 ENCOUNTER — Ambulatory Visit (INDEPENDENT_AMBULATORY_CARE_PROVIDER_SITE_OTHER): Payer: BC Managed Care – PPO | Admitting: Rheumatology

## 2020-06-27 ENCOUNTER — Other Ambulatory Visit: Payer: Self-pay

## 2020-06-27 ENCOUNTER — Encounter: Payer: Self-pay | Admitting: Rheumatology

## 2020-06-27 VITALS — BP 147/95 | HR 80 | Resp 15 | Ht 70.0 in | Wt 174.6 lb

## 2020-06-27 DIAGNOSIS — M79641 Pain in right hand: Secondary | ICD-10-CM

## 2020-06-27 DIAGNOSIS — M79671 Pain in right foot: Secondary | ICD-10-CM

## 2020-06-27 DIAGNOSIS — M79642 Pain in left hand: Secondary | ICD-10-CM

## 2020-06-27 DIAGNOSIS — L409 Psoriasis, unspecified: Secondary | ICD-10-CM

## 2020-06-27 DIAGNOSIS — Z79899 Other long term (current) drug therapy: Secondary | ICD-10-CM

## 2020-06-27 DIAGNOSIS — L4050 Arthropathic psoriasis, unspecified: Secondary | ICD-10-CM | POA: Diagnosis not present

## 2020-06-27 DIAGNOSIS — R030 Elevated blood-pressure reading, without diagnosis of hypertension: Secondary | ICD-10-CM

## 2020-06-27 DIAGNOSIS — Z840 Family history of diseases of the skin and subcutaneous tissue: Secondary | ICD-10-CM

## 2020-06-27 DIAGNOSIS — M79672 Pain in left foot: Secondary | ICD-10-CM

## 2020-06-27 MED ORDER — METHOTREXATE 2.5 MG PO TABS: ORAL_TABLET | ORAL | 0 refills | Status: DC

## 2020-06-27 MED ORDER — PREDNISONE 5 MG PO TABS: ORAL_TABLET | ORAL | 0 refills | Status: DC

## 2020-06-27 NOTE — Patient Instructions (Signed)
Standing Labs We placed an order today for your standing lab work.   Please have your standing labs drawn in  2 weeks, then every 3 months  If possible, please have your labs drawn 2 weeks prior to your appointment so that the provider can discuss your results at your appointment.  We have open lab daily Monday through Thursday from 1:30-4:30 PM and Friday from 1:30-4:00 PM at the office of Dr. Pollyann Savoy, [facility].   Please be advised, all patients with office appointments requiring lab work will take precedents over walk-in lab work.  If possible, please come for your lab work on Monday and Friday afternoons, as you may experience shorter wait times. The office is located at [address], [address], [place], [place] [address] No appointment is necessary.   Labs are drawn by [facility]. Please bring your co-pay at the time of your lab draw.  You may receive a bill from [facility] for your lab work.  If you wish to have your labs drawn at another location, please call the office 24 hours in advance to send orders.  If you have any questions regarding directions or hours of operation,  please call [phone].   As a reminder, please drink plenty of water prior to coming for your lab work. Thanks!

## 2020-06-28 ENCOUNTER — Other Ambulatory Visit: Payer: Self-pay

## 2020-06-28 DIAGNOSIS — R7989 Other specified abnormal findings of blood chemistry: Secondary | ICD-10-CM

## 2020-06-28 LAB — COMPLETE METABOLIC PANEL WITH GFR
AG Ratio: 1.5 (calc) (ref 1.0–2.5)
ALT: 51 U/L — ABNORMAL HIGH (ref 9–46)
AST: 30 U/L (ref 10–40)
Albumin: 4.1 g/dL (ref 3.6–5.1)
Alkaline phosphatase (APISO): 69 U/L (ref 36–130)
BUN: 13 mg/dL (ref 7–25)
CO2: 29 mmol/L (ref 20–32)
Calcium: 9.3 mg/dL (ref 8.6–10.3)
Chloride: 103 mmol/L (ref 98–110)
Creat: 0.86 mg/dL (ref 0.60–1.35)
GFR, Est African American: 127 mL/min/{1.73_m2} (ref >=60)
GFR, Est Non African American: 110 mL/min/{1.73_m2} (ref >=60)
Globulin: 2.7 g/dL (calc) (ref 1.9–3.7)
Glucose, Bld: 98 mg/dL (ref 65–99)
Potassium: 3.9 mmol/L (ref 3.5–5.3)
Sodium: 140 mmol/L (ref 135–146)
Total Bilirubin: 0.5 mg/dL (ref 0.2–1.2)
Total Protein: 6.8 g/dL (ref 6.1–8.1)

## 2020-06-28 LAB — CBC WITH DIFFERENTIAL/PLATELET
Absolute Monocytes: 882 cells/uL (ref 200–950)
Basophils Absolute: 104 cells/uL (ref 0–200)
Basophils Relative: 0.9 %
Eosinophils Absolute: 255 cells/uL (ref 15–500)
Eosinophils Relative: 2.2 %
HCT: 44.8 % (ref 38.5–50.0)
Hemoglobin: 14.2 g/dL (ref 13.2–17.1)
Lymphs Abs: 2053 cells/uL (ref 850–3900)
MCH: 27.7 pg (ref 27.0–33.0)
MCHC: 31.7 g/dL — ABNORMAL LOW (ref 32.0–36.0)
MCV: 87.3 fL (ref 80.0–100.0)
MPV: 9.3 fL (ref 7.5–12.5)
Monocytes Relative: 7.6 %
Neutro Abs: 8306 cells/uL — ABNORMAL HIGH (ref 1500–7800)
Neutrophils Relative %: 71.6 %
Platelets: 375 10*3/uL (ref 140–400)
RBC: 5.13 10*6/uL (ref 4.20–5.80)
RDW: 12.9 % (ref 11.0–15.0)
Total Lymphocyte: 17.7 %
WBC: 11.6 10*3/uL — ABNORMAL HIGH (ref 3.8–10.8)

## 2020-06-28 NOTE — Progress Notes (Signed)
White cell count is elevated due to prednisone use.  ALT is elevated.  Please advise patient to avoid all NSAIDs and alcohol.  We will check AST and ALT in 1 month.  If his liver functions are still elevated we may have to reduce the dose of methotrexate and try any other medications.

## 2020-07-18 ENCOUNTER — Other Ambulatory Visit: Payer: Self-pay

## 2020-07-18 MED ORDER — METHOTREXATE 2.5 MG PO TABS: ORAL_TABLET | ORAL | 0 refills | Status: DC

## 2020-07-18 NOTE — Telephone Encounter (Signed)
Patient advised the reason his prescription had been denied is our computer system shows the prescription was sent in on 06/27/2020 for a 90 day supply. Reviewing the prescription it shows that it [facility]. Patient request the prescription to be [facility] to [facility].   Next Visit: 09/01/2020  Last Visit: 06/27/2020   DX: Psoriatic arthritis   Current Dose per office note 06/27/2020: I will also increase methotrexate to 8 tablets p.o. weekly  Labs: 06/27/2020 White cell count is elevated due to prednisone use. ALT is elevated.   Okay to refill MTX?

## 2020-07-18 NOTE — Telephone Encounter (Signed)
Patient called stating he received notification from [facility] that his Methotrexate prescription was rejected by Dr. Corliss Skains.  Patient requested a return call to let him know if labwork is needed.  Patient states he is out of tablets and due to take them on Monday, 07/24/20.

## 2020-07-25 ENCOUNTER — Other Ambulatory Visit: Payer: Self-pay | Admitting: Rheumatology

## 2020-07-27 ENCOUNTER — Ambulatory Visit: Payer: BC Managed Care – PPO | Admitting: Family Medicine

## 2020-08-18 NOTE — Progress Notes (Signed)
Office Visit Note  Patient: Tyler Matthews             Date of Birth: 07-Oct-1980           MRN: [id number]             PCP: Brynda Greathouse, MD Referring: Jearld Adjutant * Visit Date: 09/01/2020 Occupation: @GUAROCC @  Subjective:  Right ankle pain   History of Present Illness: Tyler Matthews is a 40 y.o. male with a history of psoriatic arthritis and psoriasis.  He states he continues to have pain and stiffness in his right ankle joint.  He has difficulty after prolonged sitting and walking.  He describes tenderness over the right Achilles tendon.  None of the other joints are painful.  He has been taking methotrexate on a regular basis.  He denies any psoriasis currently.  Activities of Daily Living:  Patient reports morning stiffness for 0 minutes.   Patient Denies nocturnal pain.  Difficulty dressing/grooming: Denies Difficulty climbing stairs: Denies Difficulty getting out of chair: Denies Difficulty using hands for taps, buttons, cutlery, and/or writing: Denies  Review of Systems  Constitutional:  Negative for fatigue.  HENT:  Negative for mouth sores, mouth dryness and nose dryness.   Eyes:  Negative for pain, itching, visual disturbance and dryness.  Respiratory:  Negative for cough, hemoptysis, shortness of breath and difficulty breathing.   Cardiovascular:  Negative for chest pain, palpitations and swelling in legs/feet.  Gastrointestinal:  Negative for abdominal pain, blood in stool, constipation and diarrhea.  Endocrine: Negative for increased urination.  Genitourinary:  Negative for painful urination.  Musculoskeletal:  Positive for joint pain, joint pain and joint swelling. Negative for myalgias, muscle weakness, morning stiffness, muscle tenderness and myalgias.  Skin:  Negative for color change, rash and redness.  Allergic/Immunologic: Negative for susceptible to infections.  Neurological:  Negative for dizziness, numbness, headaches, memory loss  and weakness.  Hematological:  Negative for swollen glands.  Psychiatric/Behavioral:  Negative for confusion and sleep disturbance.    PMFS History:  Patient Active Problem List   Diagnosis Date Noted   Family history of psoriasis 06/14/2020   Psoriatic arthritis ([facility]) 05/29/2020   Psoriasis 05/29/2020    History reviewed. No pertinent past medical history.  Family History  Problem Relation Age of Onset   Hypertension Mother    Diabetes Father    Parkinson's disease Father    Healthy Brother    Healthy Brother    Hypertension Brother    Healthy Son    Healthy Son    History reviewed. No pertinent surgical history. Social History   Social History Narrative   Not on file   Immunization History  Administered Date(s) Administered   Moderna Sars-Covid-2 Vaccination 04/21/2019, 05/21/2019, 02/10/2020     Objective: Vital Signs: BP 135/89 (BP Location: Left Arm, Patient Position: Sitting, Cuff Size: Normal)   Pulse 64   Ht 5' 9.5" (1.765 m)   Wt 170 lb 12.8 oz (77.5 kg)   BMI 24.86 kg/m    Physical Exam Vitals and nursing note reviewed.  Constitutional:      Appearance: He is well-developed.  HENT:     Head: Normocephalic and atraumatic.  Eyes:     Conjunctiva/sclera: Conjunctivae normal.     Pupils: Pupils are equal, round, and reactive to light.  Cardiovascular:     Rate and Rhythm: Normal rate and regular rhythm.     Heart sounds: Normal heart sounds.  Pulmonary:     Effort: Pulmonary  effort is normal.     Breath sounds: Normal breath sounds.  Abdominal:     General: Bowel sounds are normal.     Palpations: Abdomen is soft.  Musculoskeletal:     Cervical back: Normal range of motion and neck supple.  Skin:    General: Skin is warm and dry.     Capillary Refill: Capillary refill takes less than 2 seconds.  Neurological:     Mental Status: He is alert and oriented to person, place, and time.  Psychiatric:        Behavior: Behavior normal.      Musculoskeletal Exam: C-spine, thoracic and lumbar spine were in good range of motion.  He had no SI joint tenderness.  Shoulder joints, elbow joints, wrist joints, MCPs PIPs and DIPs with good range of motion with no synovitis.  Hip joints, knee joints, ankles with good range of motion with no synovitis.  He had no tenderness over ankles or MTPs.  He had right Achilles tenderness.  CDAI Exam: CDAI Score: -- Patient Global: --; Provider Global: -- Swollen: --; Tender: -- Joint Exam 09/01/2020   No joint exam has been documented for this visit   There is currently no information documented on the homunculus. Go to the Rheumatology activity and complete the homunculus joint exam.  Investigation: No additional findings.  Imaging: No results found.  Recent Labs: Lab Results  Component Value Date   WBC 11.6 (H) 06/27/2020   HGB 14.2 06/27/2020   PLT 375 06/27/2020   NA 140 06/27/2020   K 3.9 06/27/2020   CL 103 06/27/2020   CO2 29 06/27/2020   GLUCOSE 98 06/27/2020   BUN 13 06/27/2020   CREATININE 0.86 06/27/2020   BILITOT 0.5 06/27/2020   AST 30 06/27/2020   ALT 51 (H) 06/27/2020   PROT 6.8 06/27/2020   CALCIUM 9.3 06/27/2020   GFRAA 127 06/27/2020   QFTBGOLDPLUS NEGATIVE 05/30/2020    Speciality Comments: Dxd in [place] 2018, Started MTX 6/wk then decresed to 4/wk. dcd MTX02/22 restarted  Procedures:  No procedures performed Allergies: Patient has no known allergies.   Assessment / Plan:     Visit Diagnoses: Psoriatic arthritis ([facility]) - Dx in [place], in 2018 -history of inflammatory arthritis, Achilles tendinitis, plantar fasciitis.  He is doing better on methotrexate.  Although he has persistent right Achilles tendinitis.  His LFTs also increased.  I discussed different treatment options at length with him.  After reviewing indications side effects contraindications we decided to proceed with Tremfya.  Handout was given and consent was taken.  We will apply for  Tremfya.  Counseled patient that Tremfya is a IL-23 inhibitor.  Counseled patient on purpose, proper use, and adverse effects of Tremfya.  Reviewed the most common adverse effects including infection, URTIs, injection site reactions, nausea/diarrhea, and recurrence of tinea and HSV infections Counseled patient that Tremfya should be held for infection and prior to scheduled surgery.  Recommend annual influenza, PCV 15 or PCV20 or Pneumovax 23, and Shingrix as indicated.  Reviewed the importance of regular labs while on Tremfya therapy.  Will monitor CBC and CMP 1 month after starting and then every 3 months routinely thereafter. Will monitor TB gold annually. Standing orders placed.  Provided patient with medication education material and answered all questions.  Patient voiced understanding.  Patient consented to [facility].  Will upload consent into the media tab.  Reviewed storage instructions of Tremfya.  Advised initial injection must be administered in office.  Patient  verbalized understanding.  Will apply for Tremfya through patient's insurance and update when we receive a response.  Dose will be Tremfya 100mg  mg on weeks 0 and 4 then every 8 weeks thereafter.  Prescription pending lab results and insurance approval.   Psoriasis - Diagnosed in 2016.  Psoriasis is limited to the scalp and responds to topical agents.   High risk medication use - methotrexate 8 tablets p.o. weekly and folic acid 2mg  daily.  -Labs from May 2022 showed elevated WBC count due to prednisone use and elevated LFTs due to methotrexate.  We will check labs today.  I plan to discontinue methotrexate if his LFTs are elevated.  Plan: CBC with Differential/Platelet, COMPLETE METABOLIC PANEL WITH GFR.  He has been advised to stop methotrexate in case he develops any infection.  Instructions were placed in the AVS.  Instructions regarding realization were also placed in the AVS.  Pain in both hands - X-rays were unremarkable.  He had  no synovitis on examination.  Pain in both feet - X-rays were unremarkable.  He had no synovitis.  Family history of psoriasis - Paternal uncle and first cousin.  History of psoriatic arthritis in paternal uncle.  Essential hypertension  Orders: Orders Placed This Encounter  Procedures   CBC with Differential/Platelet   COMPLETE METABOLIC PANEL WITH GFR   No orders of the defined types were placed in this encounter.    Follow-Up Instructions: Return for Psoriatic arthritis.   , MD  Note - This record has been created using June 2022.  Chart creation errors have been sought, but may not always  have been located. Such creation errors do not reflect on  the standard of medical care.

## 2020-09-01 ENCOUNTER — Ambulatory Visit (INDEPENDENT_AMBULATORY_CARE_PROVIDER_SITE_OTHER): Payer: BC Managed Care – PPO | Admitting: Rheumatology

## 2020-09-01 ENCOUNTER — Other Ambulatory Visit: Payer: Self-pay

## 2020-09-01 ENCOUNTER — Telehealth: Payer: Self-pay

## 2020-09-01 ENCOUNTER — Encounter: Payer: Self-pay | Admitting: Rheumatology

## 2020-09-01 VITALS — BP 135/89 | HR 64 | Ht 69.5 in | Wt 170.8 lb

## 2020-09-01 DIAGNOSIS — L409 Psoriasis, unspecified: Secondary | ICD-10-CM

## 2020-09-01 DIAGNOSIS — Z840 Family history of diseases of the skin and subcutaneous tissue: Secondary | ICD-10-CM

## 2020-09-01 DIAGNOSIS — Z79899 Other long term (current) drug therapy: Secondary | ICD-10-CM | POA: Diagnosis not present

## 2020-09-01 DIAGNOSIS — M79641 Pain in right hand: Secondary | ICD-10-CM | POA: Diagnosis not present

## 2020-09-01 DIAGNOSIS — L4050 Arthropathic psoriasis, unspecified: Secondary | ICD-10-CM

## 2020-09-01 DIAGNOSIS — M79671 Pain in right foot: Secondary | ICD-10-CM

## 2020-09-01 DIAGNOSIS — R030 Elevated blood-pressure reading, without diagnosis of hypertension: Secondary | ICD-10-CM

## 2020-09-01 DIAGNOSIS — I10 Essential (primary) hypertension: Secondary | ICD-10-CM

## 2020-09-01 DIAGNOSIS — M79642 Pain in left hand: Secondary | ICD-10-CM

## 2020-09-01 DIAGNOSIS — M79672 Pain in left foot: Secondary | ICD-10-CM

## 2020-09-01 LAB — CBC WITH DIFFERENTIAL/PLATELET
Absolute Monocytes: 784 cells/uL (ref 200–950)
Basophils Absolute: 88 cells/uL (ref 0–200)
Basophils Relative: 1.1 %
Eosinophils Absolute: 464 cells/uL (ref 15–500)
Eosinophils Relative: 5.8 %
HCT: 43.6 % (ref 38.5–50.0)
Hemoglobin: 14.3 g/dL (ref 13.2–17.1)
Lymphs Abs: 1952 cells/uL (ref 850–3900)
MCH: 29.2 pg (ref 27.0–33.0)
MCHC: 32.8 g/dL (ref 32.0–36.0)
MCV: 89 fL (ref 80.0–100.0)
MPV: 9.6 fL (ref 7.5–12.5)
Monocytes Relative: 9.8 %
Neutro Abs: 4712 cells/uL (ref 1500–7800)
Neutrophils Relative %: 58.9 %
Platelets: 369 10*3/uL (ref 140–400)
RBC: 4.9 10*6/uL (ref 4.20–5.80)
RDW: 15.3 % — ABNORMAL HIGH (ref 11.0–15.0)
Total Lymphocyte: 24.4 %
WBC: 8 10*3/uL (ref 3.8–10.8)

## 2020-09-01 LAB — COMPLETE METABOLIC PANEL WITH GFR
AG Ratio: 1.6 (calc) (ref 1.0–2.5)
ALT: 41 U/L (ref 9–46)
AST: 21 U/L (ref 10–40)
Albumin: 4.4 g/dL (ref 3.6–5.1)
Alkaline phosphatase (APISO): 80 U/L (ref 36–130)
BUN: 9 mg/dL (ref 7–25)
CO2: 31 mmol/L (ref 20–32)
Calcium: 9.6 mg/dL (ref 8.6–10.3)
Chloride: 104 mmol/L (ref 98–110)
Creat: 0.91 mg/dL (ref 0.60–1.26)
Globulin: 2.8 g/dL (calc) (ref 1.9–3.7)
Glucose, Bld: 74 mg/dL (ref 65–99)
Potassium: 4.3 mmol/L (ref 3.5–5.3)
Sodium: 140 mmol/L (ref 135–146)
Total Bilirubin: 0.6 mg/dL (ref 0.2–1.2)
Total Protein: 7.2 g/dL (ref 6.1–8.1)
eGFR: 110 mL/min/{1.73_m2} (ref >=60)

## 2020-09-01 NOTE — Patient Instructions (Addendum)
Guselkumab injection What is this medication? GUSELKUMAB (goo ZELK ue mab) is used to treat plaque psoriasis and psoriaticarthritis. This medicine may be used for other purposes; ask your health care provider orpharmacist if you have questions. COMMON BRAND NAME(S): Tremfya What should I tell my care team before I take this medication? They need to know if you have any of these conditions: immune system problems infection (especially a virus infection such as chickenpox, cold sores, or herpes) or history of infections recently received or scheduled to receive a vaccine tuberculosis, a positive skin test for tuberculosis, or have recently been in close contact with someone who has tuberculosis an unusual or allergic reaction to guselkumab, other medicines, foods, dyes, or preservatives pregnant or trying to get pregnant breast-feeding How should I use this medication? This medicine is for injection under the skin. It may be administered by a healthcare professional in a hospital or clinic setting or at home. If you get this medicine at home, you will be taught how to prepare and give this medicine. Use exactly as directed. Take your medicine at regular intervals. Donot take your medicine more often than directed. It is important that you put your used injectors, needles and syringes in a special sharps container. Do not put them in a trash can. If you do not have asharps container, call your pharmacist or healthcare provider to get one. A special MedGuide will be given to you by the pharmacist with eachprescription and refill. Be sure to read this information carefully each time. Talk to your pediatrician regarding the use of this medicine in children.Special care may be needed. Overdosage: If you think you have taken too much of this medicine contact apoison control center or emergency room at once. NOTE: This medicine is only for you. Do not share this medicine with others. What if I miss a  dose? It is important not to miss your dose. Call your doctor of health care professional if you are unable to keep an appointment. If you give yourself the medicine and you miss a dose, take it as soon as you can. Then, take your nextdose at your regular scheduled time. What may interact with this medication? Do not take this medicine with any of the following medications: live virus vaccines This medicine may also interact with the following medications: amoxapine certain medicines for depression, anxiety, or psychotic disturbances like amitriptyline, clomipramine, desipramine, doxepin, imipramine, maprotiline, nortriptyline, protriptyline, trimipramine codeine inactivated vaccines methadone pimozide thioridazine This list may not describe all possible interactions. Give your health care provider a list of all the medicines, herbs, non-prescription drugs, or dietary supplements you use. Also tell them if you smoke, drink alcohol, or use illegaldrugs. Some items may interact with your medicine. What should I watch for while using this medication? Your condition will be monitored carefully while you are receiving this medicine. Tell your doctor or healthcare professional if your symptoms do notstart to get better or if they get worse. You will be tested for tuberculosis (TB) before you start this medicine. If your doctor prescribes any medicine for TB, you should start taking the TB medicine before starting this medicine. Make sure to finish the full course ofTB medicine. Call your doctor or health care professional if you get a cold or other infection while receiving this medicine. Do not treat yourself. This medicinemay decrease your body's ability to fight infection. What side effects may I notice from receiving this medication? Side effects that you should report to your doctor  or health care professionalas soon as possible: allergic reactions like skin rash, itching or hives, swelling of the  face, lips, or tongue breathing problems chest pain or chest tightness dizziness, feeling faint or lightheaded signs and symptoms of infection like fever or chills; cough; sore throat; pain or trouble passing urine Side effects that usually do not require medical attention (report these toyour doctor or health care professional if they continue or are bothersome): diarrhea headache joint pain pain, redness, irritation at site where injected This list may not describe all possible side effects. Call your doctor for medical advice about side effects. You may report side effects to FDA at1-800-FDA-1088. Where should I keep my medication? Keep out of the reach of children. Store unopened syringes or injectors in a refrigerator between 2 to 8 degrees C (36 to 46 degrees F). Keep in the original container until ready for use. Protect from light. Do not freeze. Do not shake. Prior to use, remove the syringe or injector from the refrigerator and use after 30 minutes at room temperature. Throw away any unused medicine after the expiration date on thelabel. NOTE: This sheet is a summary. It may not cover all possible information. If you have questions about this medicine, talk to your doctor, pharmacist, orhealth care provider.  2022 Elsevier/Gold Standard (2018-08-26 15:10:10)  Standing Labs We placed an order today for your standing lab work.   Please have your standing labs drawn in 1 month after starting Tremfya and then every 3 months.  If possible, please have your labs drawn 2 weeks prior to your appointment so that the provider can discuss your results at your appointment.  Please note that you may see your imaging and lab results in MyChart before we have reviewed them. We may be awaiting multiple results to interpret others before contacting you. Please allow our office up to 72 hours to thoroughly review all of the results before contacting the office for clarification of your  results.  We have open lab daily: Monday through Thursday from 1:30-4:30 PM and Friday from 1:30-4:00 PM at the office of Dr. Pollyann Savoy, [facility].   Please be advised, all patients with office appointments requiring lab work will take precedent over walk-in lab work.  If possible, please come for your lab work on Monday and Friday afternoons, as you may experience shorter wait times. The office is located at [address], [address], [place], [place] [address] No appointment is necessary.   Labs are drawn by [facility]. Please bring your co-pay at the time of your lab draw.  You may receive a bill from [facility] for your lab work.  If you wish to have your labs drawn at another location, please call the office 24 hours in advance to send orders.  If you have any questions regarding directions or hours of operation,  please call [phone].   As a reminder, please drink plenty of water prior to coming for your lab work. Thanks!  If you test POSITIVE for COVID19 and have MILD to MODERATE symptoms: First, call your PCP if you would like to receive COVID19 treatment AND Hold your medications during the infection and for at least 1 week after your symptoms have resolved: Injectable medication (Benlysta, Cimzia, Cosentyx, Enbrel, Humira, Orencia, Remicade, Simponi, Stelara, Taltz, Tremfya) Methotrexate Leflunomide (Arava) Azathioprine Mycophenolate (Cellcept) Harriette Ohara, Olumiant, or Rinvoq If you take Actemra or Kevzara, you DO NOT need to hold these for COVID19 infection.  If you test POSITIVE for COVID19  and have NO symptoms: First, call your PCP if you would like to receive COVID19 treatment AND Hold your medications for at least 10 days after the day that you tested positive Injectable medication (Benlysta, Cimzia, Cosentyx, Enbrel, Humira, Orencia, Remicade, Simponi, Stelara, Taltz, Tremfya) Methotrexate Leflunomide (Arava) Azathioprine Mycophenolate  (Cellcept) Harriette Ohara, Olumiant, or Rinvoq If you take Actemra or Kevzara, you DO NOT need to hold these for COVID19 infection.  If you have signs or symptoms of an infection or start antibiotics: First, call your PCP for workup of your infection. Hold your medication through the infection, until you complete your antibiotics, and until symptoms resolve if you take the following: Injectable medication (Actemra, Benlysta, Cimzia, Cosentyx, Enbrel, Humira, Kevzara, Orencia, Remicade, Simponi, Stelara, Taltz, Tremfya) Methotrexate Leflunomide (Arava) Mycophenolate (Cellcept) Harriette Ohara, Olumiant, or Rinvoq  Vaccines You are taking a medication(s) that can suppress your immune system.  The following immunizations are recommended: Flu annually Covid-19  Td/Tdap (tetanus, diphtheria, pertussis) every 10 years Pneumonia (Prevnar 15 then Pneumovax 23 at least 1 year apart.  Alternatively, can take Prevnar 20 without needing additional dose) Shingrix (after age 38): 2 doses from 4 weeks to 6 months apart  Please check with your PCP to make sure you are up to date.

## 2020-09-01 NOTE — Telephone Encounter (Signed)
Please apply for Tremfya per Dr. Corliss Skains. Thanks!   Consent obtained and sent to the scan center.

## 2020-09-02 NOTE — Progress Notes (Signed)
CBC and CMP are normal.

## 2020-09-04 ENCOUNTER — Other Ambulatory Visit (HOSPITAL_COMMUNITY): Payer: Self-pay

## 2020-09-04 NOTE — Telephone Encounter (Signed)
Per CoverMyMeds, TREMFYA does not require a prior authorization through patient's current prescription drug plan.  Patient must fill through [facility]: [phone]  Copay Card info: RxBIN: [id number] RxGROUP: [id number] ID: [id number]

## 2020-09-05 NOTE — Telephone Encounter (Signed)
We will add on Tremfya to methotrexate.  If patient does well on Tremfya then we can drop methotrexate.

## 2020-09-05 NOTE — Telephone Encounter (Signed)
Patient scheduled for Tremfya new start on 09/06/20 @9 :30am

## 2020-09-06 ENCOUNTER — Ambulatory Visit: Payer: BC Managed Care – PPO | Admitting: Pharmacist

## 2020-09-06 ENCOUNTER — Other Ambulatory Visit: Payer: Self-pay

## 2020-09-06 VITALS — BP 134/85 | HR 65

## 2020-09-06 DIAGNOSIS — L4050 Arthropathic psoriasis, unspecified: Secondary | ICD-10-CM

## 2020-09-06 DIAGNOSIS — L409 Psoriasis, unspecified: Secondary | ICD-10-CM

## 2020-09-06 MED ORDER — TREMFYA 100 MG/ML ~~LOC~~ SOAJ: SUBCUTANEOUS | 0 refills | Status: DC

## 2020-09-06 NOTE — Progress Notes (Signed)
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  He takes methotrexate 20mg  once weekly as prescribed. Though he is naive to injectable medications, he did help his spouse administer injections so feels comfortable self-administering.  Patient running a fever or have signs/symptoms of infection? No  Patient currently on antibiotics for the treatment of infection? No  Patient have any upcoming invasive procedures/surgeries? No  Objective: CMP     Component Value Date/Time   NA 140 09/01/2020 1230   K 4.3 09/01/2020 1230   CL 104 09/01/2020 1230   CO2 31 09/01/2020 1230   GLUCOSE 74 09/01/2020 1230   BUN 9 09/01/2020 1230   CREATININE 0.91 09/01/2020 1230   CALCIUM 9.6 09/01/2020 1230   PROT 7.2 09/01/2020 1230   AST 21 09/01/2020 1230   ALT 41 09/01/2020 1230   BILITOT 0.6 09/01/2020 1230   GFRNONAA 110 06/27/2020 1116   GFRAA 127 06/27/2020 1116    CBC    Component Value Date/Time   WBC 8.0 09/01/2020 1230   RBC 4.90 09/01/2020 1230   HGB 14.3 09/01/2020 1230   HCT 43.6 09/01/2020 1230   PLT 369 09/01/2020 1230   MCV 89.0 09/01/2020 1230   MCH 29.2 09/01/2020 1230   MCHC 32.8 09/01/2020 1230   RDW 15.3 (H) 09/01/2020 1230   LYMPHSABS 1,952 09/01/2020 1230   EOSABS 464 09/01/2020 1230   BASOSABS 88 09/01/2020 1230    Baseline Immunosuppressant Therapy Labs TB GOLD Quantiferon TB Gold Latest Ref Rng & Units 05/30/2020  Quantiferon TB Gold Plus NEGATIVE NEGATIVE   Hepatitis Panel Hepatitis Latest Ref Rng & Units 05/30/2020  Hep B Surface Ag NON-REACTI NON-REACTIVE  Hep B IgM NON-REACTI NON-REACTIVE  Hep C Ab NON-REACTI NON-REACTIVE  Hep C Ab NON-REACTI NON-REACTIVE   HIV Lab Results  Component Value Date   HIV NON-REACTIVE 05/30/2020   Immunoglobulins Immunoglobulin Electrophoresis Latest Ref Rng & Units 05/30/2020  IgA  47 - 310 mg/dL 06/01/2020)  IgG 737(T - 062  mg/dL 6,948  IgM 50 - 5,462 mg/dL 75   SPEP Serum Protein Electrophoresis Latest Ref Rng & Units 09/01/2020  Total Protein 6.1 - 8.1 g/dL 7.2  Albumin 3.8 - 4.8 g/dL -  Alpha-1 0.2 - 0.3 g/dL -  Alpha-2 0.5 - 0.9 g/dL -  Beta Globulin 0.4 - 0.6 g/dL -  Beta 2 0.2 - 0.5 g/dL -  Gamma Globulin 0.8 - 1.7 g/dL -   Chest x-ray: 09/03/2020 - no active cardiopulmonary disease on 05/29/20  Assessment/Plan:  Counseled patient that Tremfya is a IL-23 inhibitor.  Counseled patient on purpose, proper use, and adverse effects of Tremfya.  Reviewed the most common adverse effects including infection, URTIs, injection site reactions, nausea/diarrhea, and recurrence of tinea and HSV infections Counseled patient that Tremfya should be held for infection and prior to scheduled surgery.  Reviewed that he will continue methotrexate in combination with Tremfya until assessment of clinical efficacy can be made at f/u visit - he verbalized understanding.  Demonstrated proper injection technique with Tremfya demo device  Patient able to demonstrate proper injection technique using the teach back method.  Patient self injected in the right lower abdomen with:  Sample Medication: Tremfya 100mg /mL autoinjcetor NDC: 05/31/20 Lot: [id number] Expiration: 12/2021  Patient tolerated well.  Observed for 30 mins in office for adverse reaction and none noted.   Patient is to return in 1 month for labs and  6-8 weeks for follow-up appointment.  Standing orders placed.   Tremfya approved through insurance.  Prescription sent to [facility]. Patient provided with pharmacy phone number and copay card information.  He will continue Tremfya at Week 4 (on 10/04/20) and every 8 weeks thereafter starting on 11/29/20. He will continue methotrexate 20mg  weekly as prescribed (with folic acid 2mg  once daily).  All questions encouraged and answered.  Instructed patient to call with any further questions or  concerns.  , PharmD, MPH, BCPS Clinical Pharmacist (Rheumatology and Pulmonology)  09/06/2020 7:55 AM

## 2020-09-06 NOTE — Patient Instructions (Addendum)
Your next Tremfya dose is due on 8/24, 10/19, and every 8 weeks thereafter  Your prescription will be shipped from [facility]. Their phone number is [phone]   Please call to schedule shipment and confirm address. They will mail medication to your home.  Your copay should be affordable. If you call the pharmacy and it is not affordable, please double-check that they are billing through your copay card as secondary coverage. That copay card information is: RxBIN: [id number] RxGROUP: [id number] ID: [id number]  Labs are due in 1 month then every 3 months.  Remember the 5 C's: COUNTER - leave on the counter at least 30 minutes but up to overnight to bring medication to room temperature. This may help prevent stinging COLD - place something cold (like an ice gel pack or cold water bottle) on the injection site just before cleansing with alcohol. This may help reduce pain CLARITIN - use Claritin (generic name is loratadine) for the first two weeks of treatment or the day of, the day before, and the day after injecting. This will help to minimize injection site reactions CORTISONE CREAM - apply if injection site is irritated and itching CALL ME - if injection site reaction is bigger than the size of your fist, looks infected, blisters, or if you develop hives  Guselkumab injection What is this medication? GUSELKUMAB (goo ZELK ue mab) is used to treat plaque psoriasis and psoriaticarthritis. This medicine may be used for other purposes; ask your health care provider orpharmacist if you have questions. COMMON BRAND NAME(S): Tremfya What should I tell my care team before I take this medication? They need to know if you have any of these conditions: immune system problems infection (especially a virus infection such as chickenpox, cold sores, or herpes) or history of infections recently received or scheduled to receive a vaccine tuberculosis, a positive skin test for tuberculosis,  or have recently been in close contact with someone who has tuberculosis an unusual or allergic reaction to guselkumab, other medicines, foods, dyes, or preservatives pregnant or trying to get pregnant breast-feeding How should I use this medication? This medicine is for injection under the skin. It may be administered by a healthcare professional in a hospital or clinic setting or at home. If you get this medicine at home, you will be taught how to prepare and give this medicine. Use exactly as directed. Take your medicine at regular intervals. Donot take your medicine more often than directed. It is important that you put your used injectors, needles and syringes in a special sharps container. Do not put them in a trash can. If you do not have asharps container, call your pharmacist or healthcare provider to get one. A special MedGuide will be given to you by the pharmacist with eachprescription and refill. Be sure to read this information carefully each time. Talk to your pediatrician regarding the use of this medicine in children.Special care may be needed. Overdosage: If you think you have taken too much of this medicine contact apoison control center or emergency room at once. NOTE: This medicine is only for you. Do not share this medicine with others. What if I miss a dose? It is important not to miss your dose. Call your doctor of health care professional if you are unable to keep an appointment. If you give yourself the medicine and you miss a dose, take it as soon as you can. Then, take your nextdose at your regular scheduled time. What may interact with this  medication? Do not take this medicine with any of the following medications: live virus vaccines This medicine may also interact with the following medications: amoxapine certain medicines for depression, anxiety, or psychotic disturbances like amitriptyline, clomipramine, desipramine, doxepin, imipramine, maprotiline, nortriptyline,  protriptyline, trimipramine codeine inactivated vaccines methadone pimozide thioridazine This list may not describe all possible interactions. Give your health care provider a list of all the medicines, herbs, non-prescription drugs, or dietary supplements you use. Also tell them if you smoke, drink alcohol, or use illegaldrugs. Some items may interact with your medicine. What should I watch for while using this medication? Your condition will be monitored carefully while you are receiving this medicine. Tell your doctor or healthcare professional if your symptoms do notstart to get better or if they get worse. You will be tested for tuberculosis (TB) before you start this medicine. If your doctor prescribes any medicine for TB, you should start taking the TB medicine before starting this medicine. Make sure to finish the full course ofTB medicine. Call your doctor or health care professional if you get a cold or other infection while receiving this medicine. Do not treat yourself. This medicinemay decrease your body's ability to fight infection. What side effects may I notice from receiving this medication? Side effects that you should report to your doctor or health care professionalas soon as possible: allergic reactions like skin rash, itching or hives, swelling of the face, lips, or tongue breathing problems chest pain or chest tightness dizziness, feeling faint or lightheaded signs and symptoms of infection like fever or chills; cough; sore throat; pain or trouble passing urine Side effects that usually do not require medical attention (report these toyour doctor or health care professional if they continue or are bothersome): diarrhea headache joint pain pain, redness, irritation at site where injected This list may not describe all possible side effects. Call your doctor for medical advice about side effects. You may report side effects to FDA at1-800-FDA-1088. Where should I keep my  medication? Keep out of the reach of children. Store unopened syringes or injectors in a refrigerator between 2 to 8 degrees C (36 to 46 degrees F). Keep in the original container until ready for use. Protect from light. Do not freeze. Do not shake. Prior to use, remove the syringe or injector from the refrigerator and use after 30 minutes at room temperature. Throw away any unused medicine after the expiration date on thelabel. NOTE: This sheet is a summary. It may not cover all possible information. If you have questions about this medicine, talk to your doctor, pharmacist, orhealth care provider.  2022 Elsevier/Gold Standard (2018-08-26 15:10:10)

## 2020-10-21 ENCOUNTER — Other Ambulatory Visit: Payer: Self-pay | Admitting: Physician Assistant

## 2020-10-23 NOTE — Telephone Encounter (Signed)
Next Visit: 11/21/2020  Last Visit: 09/01/2020  Last Fill: 07/18/2020   DX: Psoriatic arthritis   Current Dose per office note 09/01/2020: methotrexate 8 tablets p.o. weekly   Labs: 09/01/2020 CBC and CMP are normal  Okay to refill MTX?

## 2020-11-07 NOTE — Progress Notes (Deleted)
Office Visit Note  Patient: Tyler Matthews             Date of Birth: 08/21/1980           MRN: [id number]             PCP: Brynda Greathouse, MD Referring: Jearld Adjutant * Visit Date: 11/21/2020 Occupation: @GUAROCC @  Subjective:  No chief complaint on file.   History of Present Illness: Cataldo Cosgriff is a 40 y.o. male ***   Activities of Daily Living:  Patient reports morning stiffness for *** {minute/hour:19697}.   Patient {ACTIONS;DENIES/REPORTS:21021675::"Denies"} nocturnal pain.  Difficulty dressing/grooming: {ACTIONS;DENIES/REPORTS:21021675::"Denies"} Difficulty climbing stairs: {ACTIONS;DENIES/REPORTS:21021675::"Denies"} Difficulty getting out of chair: {ACTIONS;DENIES/REPORTS:21021675::"Denies"} Difficulty using hands for taps, buttons, cutlery, and/or writing: {ACTIONS;DENIES/REPORTS:21021675::"Denies"}  No Rheumatology ROS completed.   PMFS History:  Patient Active Problem List   Diagnosis Date Noted   Family history of psoriasis 06/14/2020   Psoriatic arthritis (HCC) 05/29/2020   Psoriasis 05/29/2020    No past medical history on file.  Family History  Problem Relation Age of Onset   Hypertension Mother    Diabetes Father    Parkinson's disease Father    Healthy Brother    Healthy Brother    Hypertension Brother    Healthy Son    Healthy Son    No past surgical history on file. Social History   Social History Narrative   Not on file   Immunization History  Administered Date(s) Administered   Moderna Sars-Covid-2 Vaccination 04/21/2019, 05/21/2019, 02/10/2020     Objective: Vital Signs: There were no vitals taken for this visit.   Physical Exam   Musculoskeletal Exam: ***  CDAI Exam: CDAI Score: -- Patient Global: --; Provider Global: -- Swollen: --; Tender: -- Joint Exam 11/21/2020   No joint exam has been documented for this visit   There is currently no information documented on the homunculus. Go to the  Rheumatology activity and complete the homunculus joint exam.  Investigation: No additional findings.  Imaging: No results found.  Recent Labs: Lab Results  Component Value Date   WBC 8.0 09/01/2020   HGB 14.3 09/01/2020   PLT 369 09/01/2020   NA 140 09/01/2020   K 4.3 09/01/2020   CL 104 09/01/2020   CO2 31 09/01/2020   GLUCOSE 74 09/01/2020   BUN 9 09/01/2020   CREATININE 0.91 09/01/2020   BILITOT 0.6 09/01/2020   AST 21 09/01/2020   ALT 41 09/01/2020   PROT 7.2 09/01/2020   CALCIUM 9.6 09/01/2020   GFRAA 127 06/27/2020   QFTBGOLDPLUS NEGATIVE 05/30/2020    Speciality Comments: Dxd in 06/01/2020 2018, Started MTX 6/wk then decresed to 4/wk. dcd MTX02/22 restarted Tremfya started 09/06/20  Procedures:  No procedures performed Allergies: Patient has no known allergies.   Assessment / Plan:     Visit Diagnoses: Psoriatic arthritis (HCC)  Psoriasis  High risk medication use  De Quervain's tenosynovitis, right  Elevated LFTs  Family history of psoriasis  Essential hypertension  Orders: No orders of the defined types were placed in this encounter.  No orders of the defined types were placed in this encounter.   Face-to-face time spent with patient was *** minutes. Greater than 50% of time was spent in counseling and coordination of care.  Follow-Up Instructions: No follow-ups on file.   09/08/20, PA-C  Note - This record has been created using Dragon software.  Chart creation errors have been sought, but may not always  have been located. Such creation  errors do not reflect on  the standard of medical care.

## 2020-11-21 ENCOUNTER — Ambulatory Visit: Payer: BC Managed Care – PPO | Admitting: Rheumatology

## 2020-11-21 DIAGNOSIS — I10 Essential (primary) hypertension: Secondary | ICD-10-CM

## 2020-11-21 DIAGNOSIS — Z79899 Other long term (current) drug therapy: Secondary | ICD-10-CM

## 2020-11-21 DIAGNOSIS — R7989 Other specified abnormal findings of blood chemistry: Secondary | ICD-10-CM

## 2020-11-21 DIAGNOSIS — M654 Radial styloid tenosynovitis [de Quervain]: Secondary | ICD-10-CM

## 2020-11-21 DIAGNOSIS — L409 Psoriasis, unspecified: Secondary | ICD-10-CM

## 2020-11-21 DIAGNOSIS — Z840 Family history of diseases of the skin and subcutaneous tissue: Secondary | ICD-10-CM

## 2020-11-21 DIAGNOSIS — L4050 Arthropathic psoriasis, unspecified: Secondary | ICD-10-CM

## 2020-12-05 NOTE — Progress Notes (Signed)
Office Visit Note  Patient: Tyler Matthews             Date of Birth: 1980/06/18           MRN: [id number]             PCP: Brynda Greathouse, MD Referring: Jearld Adjutant * Visit Date: 12/07/2020 Occupation: @GUAROCC @  Subjective:  Medication monitoring   History of Present Illness: Tyler Matthews is a 40 y.o. male with a history of psoriatic arthritis and psoriasis.  He states his symptoms improved after taking Tremfya.  He has had no recurrence of Achilles tendinitis.  He denies any joint pain or joint swelling.  He would like to taper off of methotrexate.  He denies any psoriasis.  Activities of Daily Living:  Patient reports morning stiffness for 0  none .   Patient Denies nocturnal pain.  Difficulty dressing/grooming: Denies Difficulty climbing stairs: Denies Difficulty getting out of chair: Denies Difficulty using hands for taps, buttons, cutlery, and/or writing: Denies  Review of Systems  Constitutional:  Positive for fatigue.  HENT:  Negative for mouth dryness.   Eyes:  Negative for dryness.  Respiratory:  Negative for shortness of breath.   Cardiovascular:  Positive for swelling in legs/feet.  Gastrointestinal:  Negative for constipation.  Endocrine: Negative for excessive thirst.  Genitourinary:  Negative for difficulty urinating.  Musculoskeletal:  Negative for joint pain, joint pain and joint swelling.  Skin:  Negative for rash.  Allergic/Immunologic: Negative for susceptible to infections.  Neurological:  Negative for numbness.  Hematological:  Negative for bruising/bleeding tendency.  Psychiatric/Behavioral:  Positive for sleep disturbance.    PMFS History:  Patient Active Problem List   Diagnosis Date Noted   Family history of psoriasis 06/14/2020   Psoriatic arthritis (HCC) 05/29/2020   Psoriasis 05/29/2020    Past Medical History:  Diagnosis Date   Psoriatic arthritis (HCC)     Family History  Problem Relation Age of Onset    Hypertension Mother    Diabetes Father    Parkinson's disease Father    Healthy Brother    Healthy Brother    Hypertension Brother    Healthy Son    Healthy Son    History reviewed. No pertinent surgical history. Social History   Social History Narrative   Not on file   Immunization History  Administered Date(s) Administered   Moderna Sars-Covid-2 Vaccination 04/21/2019, 05/21/2019, 02/10/2020     Objective: Vital Signs: BP 133/78 (BP Location: Left Arm, Patient Position: Sitting, Cuff Size: Normal)   Pulse 61   Resp 14   Ht 5\' 10"  (1.778 m)   Wt 173 lb (78.5 kg)   BMI 24.82 kg/m    Physical Exam Vitals and nursing note reviewed.  Constitutional:      Appearance: He is well-developed.  HENT:     Head: Normocephalic and atraumatic.  Eyes:     Conjunctiva/sclera: Conjunctivae normal.     Pupils: Pupils are equal, round, and reactive to light.  Cardiovascular:     Rate and Rhythm: Normal rate and regular rhythm.     Heart sounds: Normal heart sounds.  Pulmonary:     Effort: Pulmonary effort is normal.     Breath sounds: Normal breath sounds.  Abdominal:     General: Bowel sounds are normal.     Palpations: Abdomen is soft.  Musculoskeletal:     Cervical back: Normal range of motion and neck supple.  Skin:    General: Skin is warm  and dry.     Capillary Refill: Capillary refill takes less than 2 seconds.  Neurological:     Mental Status: He is alert and oriented to person, place, and time.  Psychiatric:        Behavior: Behavior normal.     Musculoskeletal Exam: C-spine thoracic and lumbar spine were in good range of motion.  He had no SI joint tenderness.  Shoulder joints, elbow joints, wrist joints, MCPs PIPs and DIPs with good range of motion with no synovitis.  Hip joints, knee joints, ankles, MTPs, PIPs and DIPs were in good range of motion with no synovitis.  CDAI Exam: CDAI Score: -- Patient Global: --; Provider Global: -- Swollen: --; Tender:  -- Joint Exam 12/07/2020   No joint exam has been documented for this visit   There is currently no information documented on the homunculus. Go to the Rheumatology activity and complete the homunculus joint exam.  Investigation: No additional findings.  Imaging: No results found.  Recent Labs: Lab Results  Component Value Date   WBC 8.0 09/01/2020   HGB 14.3 09/01/2020   PLT 369 09/01/2020   NA 140 09/01/2020   K 4.3 09/01/2020   CL 104 09/01/2020   CO2 31 09/01/2020   GLUCOSE 74 09/01/2020   BUN 9 09/01/2020   CREATININE 0.91 09/01/2020   BILITOT 0.6 09/01/2020   AST 21 09/01/2020   ALT 41 09/01/2020   PROT 7.2 09/01/2020   CALCIUM 9.6 09/01/2020   GFRAA 127 06/27/2020   QFTBGOLDPLUS NEGATIVE 05/30/2020    Speciality Comments: Dxd in [place] 2018, Started MTX 6/wk then decresed to 4/wk. dcd MTX02/22 restarted Tremfya started 09/06/20  Procedures:  No procedures performed Allergies: Patient has no known allergies.   Assessment / Plan:     Visit Diagnoses: Psoriatic arthritis (HCC) - dxd 2018 in [place].h/o inflammatory arthritis, Achilles tendinitis, plantar fasciitis. -He has done very well since he added Tremfya.  The combination of Tremfya and methotrexate has been working well.  He would like to get off methotrexate.  We decided decreasing the dose of methotrexate to 6 tablets p.o. weekly for the next 2 months.  If he does well he will reduce it further to 4 tablets p.o. weekly for the next 2 months.  And then he may discontinue methotrexate if he does not develop any flares.  Plan: Sedimentation rate with the next labs.  Psoriasis - Diagnosed in 2016.  He does not have any active psoriasis lesions.  High risk medication use - Tremfya 100 mg subcu every 8 weeks started on September 06, 2020, methotrexate 8 tablets p.o. weekly, folic acid 2 mg p.o. daily -labs obtained on September 01, 2020 which included CBC with differential and CMP with GFR within normal limits.  Labs are  reviewed.  TB gold was negative on May 30, 2020.  Plan: CBC with Differential/Platelet, COMPLETE METABOLIC PANEL WITH GFR today and then every 3 months to monitor for drug toxicity.  He has been advised to stop Tremfya and methotrexate in case he develops an infection and resume after the infection resolves.  Updated information on immunization was also placed in the AVS.  Pain in both hands -he denies any discomfort in his hands today.  X-rays were unremarkable in the past.  De Quervain's tenosynovitis, right-resolved.  Achilles tendinitis, right leg - Persistent Achilles tendinitis on methotrexate monotherapy which resolved after adding Tremfya.  Pain in both feet - X-rays were unremarkable.  Family history of psoriasis  Essential hypertension -  He was placed on amlodipine 5 mg p.o. daily.  His blood pressure is well controlled on amlodipine.  Tyler Matthews has been under a lot of stress as his father recently passed away from the complications of liver cancer.  Orders: Orders Placed This Encounter  Procedures   CBC with Differential/Platelet   COMPLETE METABOLIC PANEL WITH GFR   Sedimentation rate   No orders of the defined types were placed in this encounter.    Follow-Up Instructions: Return in about 5 months (around 05/07/2021) for Psoriatic arthritis.   Pollyann Savoy, MD  Note - This record has been created using Animal nutritionist.  Chart creation errors have been sought, but may not always  have been located. Such creation errors do not reflect on  the standard of medical care.

## 2020-12-07 ENCOUNTER — Other Ambulatory Visit: Payer: Self-pay

## 2020-12-07 ENCOUNTER — Ambulatory Visit (INDEPENDENT_AMBULATORY_CARE_PROVIDER_SITE_OTHER): Payer: BC Managed Care – PPO | Admitting: Rheumatology

## 2020-12-07 ENCOUNTER — Encounter: Payer: Self-pay | Admitting: Rheumatology

## 2020-12-07 VITALS — BP 133/78 | HR 61 | Resp 14 | Ht 70.0 in | Wt 173.0 lb

## 2020-12-07 DIAGNOSIS — M79642 Pain in left hand: Secondary | ICD-10-CM

## 2020-12-07 DIAGNOSIS — Z79899 Other long term (current) drug therapy: Secondary | ICD-10-CM

## 2020-12-07 DIAGNOSIS — I10 Essential (primary) hypertension: Secondary | ICD-10-CM

## 2020-12-07 DIAGNOSIS — M79672 Pain in left foot: Secondary | ICD-10-CM

## 2020-12-07 DIAGNOSIS — M654 Radial styloid tenosynovitis [de Quervain]: Secondary | ICD-10-CM

## 2020-12-07 DIAGNOSIS — L4050 Arthropathic psoriasis, unspecified: Secondary | ICD-10-CM | POA: Diagnosis not present

## 2020-12-07 DIAGNOSIS — M79671 Pain in right foot: Secondary | ICD-10-CM

## 2020-12-07 DIAGNOSIS — Z840 Family history of diseases of the skin and subcutaneous tissue: Secondary | ICD-10-CM

## 2020-12-07 DIAGNOSIS — M79641 Pain in right hand: Secondary | ICD-10-CM

## 2020-12-07 DIAGNOSIS — L409 Psoriasis, unspecified: Secondary | ICD-10-CM | POA: Diagnosis not present

## 2020-12-07 DIAGNOSIS — M7661 Achilles tendinitis, right leg: Secondary | ICD-10-CM

## 2020-12-07 NOTE — Patient Instructions (Addendum)
Decrease methotrexate to 6 tablets by mouth weekly for the next 2 months and then 4 tablets by mouth weekly for the next 2 months.  If you do not develop any joint pain and swelling you may discontinue methotrexate.   Standing Labs We placed an order today for your standing lab work.   Please have your standing labs drawn in January and every 3 months  If possible, please have your labs drawn 2 weeks prior to your appointment so that the provider can discuss your results at your appointment.  Please note that you may see your imaging and lab results in MyChart before we have reviewed them. We may be awaiting multiple results to interpret others before contacting you. Please allow our office up to 72 hours to thoroughly review all of the results before contacting the office for clarification of your results.  We have open lab daily: Monday through Thursday from 1:30-4:30 PM and Friday from 1:30-4:00 PM at the office of Dr. Pollyann Savoy, [facility].   Please be advised, all patients with office appointments requiring lab work will take precedent over walk-in lab work.  If possible, please come for your lab work on Monday and Friday afternoons, as you may experience shorter wait times. The office is located at [address], [address], [place], [place] [address] No appointment is necessary.   Labs are drawn by [facility]. Please bring your co-pay at the time of your lab draw.  You may receive a bill from [facility] for your lab work.  If you wish to have your labs drawn at another location, please call the office 24 hours in advance to send orders.  If you have any questions regarding directions or hours of operation,  please call [phone].   As a reminder, please drink plenty of water prior to coming for your lab work. Thanks!   Vaccines You are taking a medication(s) that can suppress your immune system.  The following immunizations are recommended: Flu annually Covid-19   Td/Tdap (tetanus, diphtheria, pertussis) every 10 years Pneumonia (Prevnar 15 then Pneumovax 23 at least 1 year apart.  Alternatively, can take Prevnar 20 without needing additional dose) Shingrix: 2 doses from 4 weeks to 6 months apart  Please check with your PCP to make sure you are up to date.   If you have signs or symptoms of an infection or start antibiotics: First, call your PCP for workup of your infection. Hold your medication through the infection, until you complete your antibiotics, and until symptoms resolve if you take the following: Injectable medication (Actemra, Benlysta, Cimzia, Cosentyx, Enbrel, Humira, Kevzara, Orencia, Remicade, Simponi, Stelara, Taltz, Tremfya) Methotrexate Leflunomide (Arava) Mycophenolate (Cellcept) Harriette Ohara, Olumiant, or Rinvoq

## 2020-12-08 LAB — CBC WITH DIFFERENTIAL/PLATELET
Absolute Monocytes: 629 cells/uL (ref 200–950)
Basophils Absolute: 89 cells/uL (ref 0–200)
Basophils Relative: 1.2 %
Eosinophils Absolute: 340 cells/uL (ref 15–500)
Eosinophils Relative: 4.6 %
HCT: 46.9 % (ref 38.5–50.0)
Hemoglobin: 15.3 g/dL (ref 13.2–17.1)
Lymphs Abs: 1872 cells/uL (ref 850–3900)
MCH: 28.8 pg (ref 27.0–33.0)
MCHC: 32.6 g/dL (ref 32.0–36.0)
MCV: 88.2 fL (ref 80.0–100.0)
MPV: 9.9 fL (ref 7.5–12.5)
Monocytes Relative: 8.5 %
Neutro Abs: 4470 cells/uL (ref 1500–7800)
Neutrophils Relative %: 60.4 %
Platelets: 385 10*3/uL (ref 140–400)
RBC: 5.32 10*6/uL (ref 4.20–5.80)
RDW: 12.8 % (ref 11.0–15.0)
Total Lymphocyte: 25.3 %
WBC: 7.4 10*3/uL (ref 3.8–10.8)

## 2020-12-08 LAB — COMPLETE METABOLIC PANEL WITH GFR
AG Ratio: 1.7 (calc) (ref 1.0–2.5)
ALT: 39 U/L (ref 9–46)
AST: 22 U/L (ref 10–40)
Albumin: 4.5 g/dL (ref 3.6–5.1)
Alkaline phosphatase (APISO): 70 U/L (ref 36–130)
BUN: 12 mg/dL (ref 7–25)
CO2: 30 mmol/L (ref 20–32)
Calcium: 10.1 mg/dL (ref 8.6–10.3)
Chloride: 104 mmol/L (ref 98–110)
Creat: 0.81 mg/dL (ref 0.60–1.26)
Globulin: 2.7 g/dL (calc) (ref 1.9–3.7)
Glucose, Bld: 66 mg/dL (ref 65–99)
Potassium: 4.9 mmol/L (ref 3.5–5.3)
Sodium: 141 mmol/L (ref 135–146)
Total Bilirubin: 0.4 mg/dL (ref 0.2–1.2)
Total Protein: 7.2 g/dL (ref 6.1–8.1)
eGFR: 115 mL/min/{1.73_m2} (ref >=60)

## 2020-12-08 LAB — SEDIMENTATION RATE: Sed Rate: 2 mm/h (ref 0–15)

## 2020-12-08 NOTE — Progress Notes (Signed)
CBC, CMP and sed rate are normal.

## 2020-12-12 ENCOUNTER — Other Ambulatory Visit: Payer: Self-pay

## 2020-12-12 MED ORDER — FOLIC ACID 1 MG PO TABS: 2.0000 mg | ORAL_TABLET | Freq: Every day | ORAL | 3 refills | Status: DC

## 2020-12-12 NOTE — Telephone Encounter (Signed)
Patient called stating he received a message from [facility] informing him that Dr. Corliss Skains denied his prescription refill of Folic Acid.  Patient states he is out of medication and requested it be sent to [facility] [place].

## 2020-12-12 NOTE — Telephone Encounter (Signed)
Next Visit: 05/07/2021  Last Visit: 12/07/2020  Last Fill: 06/02/2020  Dx:  Psoriatic arthritis   Current Dose per office note on 12/07/2020: folic acid 2 mg p.o. daily   Okay to refill Folic Acid?

## 2020-12-31 ENCOUNTER — Other Ambulatory Visit: Payer: Self-pay | Admitting: Rheumatology

## 2020-12-31 DIAGNOSIS — L4050 Arthropathic psoriasis, unspecified: Secondary | ICD-10-CM

## 2020-12-31 DIAGNOSIS — L409 Psoriasis, unspecified: Secondary | ICD-10-CM

## 2021-01-01 NOTE — Telephone Encounter (Signed)
Next Visit: 05/07/2021  Last Visit: 12/07/2020  Last Fill: 09/06/2020  DX: Psoriatic arthritis   Current Dose per office note 12/07/2020: Tremfya 100 mg subcu every 8 weeks   Labs: 12/07/2020 CBC, CMP and sed rate are normal.  TB Gold: 05/30/2020 neg   Okay to refill Tremfya?

## 2021-01-02 ENCOUNTER — Other Ambulatory Visit: Payer: Self-pay | Admitting: Physician Assistant

## 2021-01-02 NOTE — Telephone Encounter (Signed)
Next Visit: 05/07/2021   Last Visit: 12/07/2020   Last Fill: 10/23/2020   DX: Psoriatic arthritis    Current Dose per office note 12/07/2020: methotrexate 8 tablets p.o. weekly   Labs: 12/07/2020 CBC, CMP and sed rate are normal.   Okay to refill MTX?

## 2021-01-12 ENCOUNTER — Other Ambulatory Visit: Payer: Self-pay | Admitting: *Deleted

## 2021-01-12 NOTE — Telephone Encounter (Signed)
Refill request received via fax  Next Visit: 05/07/2021   Last Visit: 12/07/2020   DX: Psoriatic arthritis    Current Dose per office note 12/07/2020:folic acid 2 mg p.o. daily   Okay to refill Folic Acid?

## 2021-01-15 MED ORDER — FOLIC ACID 1 MG PO TABS: 2.0000 mg | ORAL_TABLET | Freq: Every day | ORAL | 3 refills | Status: DC

## 2021-02-23 ENCOUNTER — Other Ambulatory Visit: Payer: Self-pay | Admitting: Physician Assistant

## 2021-02-23 DIAGNOSIS — L4050 Arthropathic psoriasis, unspecified: Secondary | ICD-10-CM

## 2021-02-23 DIAGNOSIS — L409 Psoriasis, unspecified: Secondary | ICD-10-CM

## 2021-02-23 NOTE — Telephone Encounter (Signed)
Next Visit: 05/07/2021  Last Visit: 12/07/2020  Last Fill: 03/03/2020  DX: Psoriatic arthritis  Current Dose per office note on 12/07/2020: Tremfya 100 mg subcu every 8 weeks  Labs: 12/07/2020 CBC, CMP and sed rate are normal.  TB Gold: 05/30/2020 negative    Okay to refill Tremfya?

## 2021-03-27 ENCOUNTER — Other Ambulatory Visit: Payer: Self-pay | Admitting: Physician Assistant

## 2021-03-27 NOTE — Telephone Encounter (Signed)
Patient advised he is due to update labs. Patient states he will come this week. Patient states he does not need a refill at this time.

## 2021-04-10 ENCOUNTER — Other Ambulatory Visit: Payer: Self-pay | Admitting: *Deleted

## 2021-04-10 DIAGNOSIS — L4050 Arthropathic psoriasis, unspecified: Secondary | ICD-10-CM

## 2021-04-10 DIAGNOSIS — L409 Psoriasis, unspecified: Secondary | ICD-10-CM

## 2021-04-10 DIAGNOSIS — Z79899 Other long term (current) drug therapy: Secondary | ICD-10-CM

## 2021-04-10 LAB — CBC WITH DIFFERENTIAL/PLATELET
Absolute Monocytes: 576 cells/uL (ref 200–950)
Basophils Absolute: 101 cells/uL (ref 0–200)
Basophils Relative: 1.5 %
Eosinophils Absolute: 436 cells/uL (ref 15–500)
Eosinophils Relative: 6.5 %
HCT: 46.9 % (ref 38.5–50.0)
Hemoglobin: 15.4 g/dL (ref 13.2–17.1)
Lymphs Abs: 2801 cells/uL (ref 850–3900)
MCH: 28.6 pg (ref 27.0–33.0)
MCHC: 32.8 g/dL (ref 32.0–36.0)
MCV: 87 fL (ref 80.0–100.0)
MPV: 10.2 fL (ref 7.5–12.5)
Monocytes Relative: 8.6 %
Neutro Abs: 2787 cells/uL (ref 1500–7800)
Neutrophils Relative %: 41.6 %
Platelets: 336 10*3/uL (ref 140–400)
RBC: 5.39 10*6/uL (ref 4.20–5.80)
RDW: 13.2 % (ref 11.0–15.0)
Total Lymphocyte: 41.8 %
WBC: 6.7 10*3/uL (ref 3.8–10.8)

## 2021-04-10 LAB — COMPLETE METABOLIC PANEL WITH GFR
AG Ratio: 1.6 (calc) (ref 1.0–2.5)
ALT: 34 U/L (ref 9–46)
AST: 25 U/L (ref 10–40)
Albumin: 4.5 g/dL (ref 3.6–5.1)
Alkaline phosphatase (APISO): 80 U/L (ref 36–130)
BUN: 10 mg/dL (ref 7–25)
CO2: 28 mmol/L (ref 20–32)
Calcium: 10.2 mg/dL (ref 8.6–10.3)
Chloride: 104 mmol/L (ref 98–110)
Creat: 0.9 mg/dL (ref 0.60–1.26)
Globulin: 2.9 g/dL (calc) (ref 1.9–3.7)
Glucose, Bld: 96 mg/dL (ref 65–99)
Potassium: 4.5 mmol/L (ref 3.5–5.3)
Sodium: 140 mmol/L (ref 135–146)
Total Bilirubin: 0.4 mg/dL (ref 0.2–1.2)
Total Protein: 7.4 g/dL (ref 6.1–8.1)
eGFR: 111 mL/min/{1.73_m2} (ref >=60)

## 2021-04-11 NOTE — Progress Notes (Signed)
CBC and CMP WNL

## 2021-04-14 ENCOUNTER — Other Ambulatory Visit: Payer: Self-pay | Admitting: Physician Assistant

## 2021-04-14 DIAGNOSIS — L4050 Arthropathic psoriasis, unspecified: Secondary | ICD-10-CM

## 2021-04-14 DIAGNOSIS — L409 Psoriasis, unspecified: Secondary | ICD-10-CM

## 2021-04-16 NOTE — Telephone Encounter (Signed)
Next Visit: 05/10/2021 ?  ?Last Visit: 12/07/2020 ?  ?Last Fill: 02/23/2021 ?  ?DX: Psoriatic arthritis ?  ?Current Dose per office note on 12/07/2020: Tremfya 100 mg subcu every 8 weeks ?  ?Labs: 04/10/2021 CBC and CMP WNL  ?  ?TB Gold: 05/30/2020 negative   ?  ?Okay to refill Tremfya?  ?

## 2021-04-26 NOTE — Progress Notes (Signed)
Office Visit Note  Patient: Tyler Matthews             Date of Birth: 1980-11-27           MRN: [id number]             PCP: Brynda Greathouse, MD Referring: Jearld Adjutant * Visit Date: 05/10/2021 Occupation: @GUAROCC @  Subjective:  Follow-up (Bil ankle swelling)   History of Present Illness: Tyler Matthews is a 41 y.o. male with history of psoriatic arthritis and psoriasis.  He had been on Tremfya and methotrexate combination since July 2022.  He decided to come off methotrexate 2 months ago due to the concern of side effects from methotrexate.  Although he was not experiencing any side effects from methotrexate.  Over the last 2 months he has noticed increased pain and swelling in his both ankles and increased joint to stiffness.  He does not have any rash.  Some fluid in his left wrist.  Activities of Daily Living:  Patient reports morning stiffness for 0  none .   Patient Denies nocturnal pain.  Difficulty dressing/grooming: Denies Difficulty climbing stairs: Denies Difficulty getting out of chair: Denies Difficulty using hands for taps, buttons, cutlery, and/or writing: Denies  Review of Systems  Constitutional:  Negative for fatigue.  HENT:  Negative for mouth dryness.   Eyes:  Negative for dryness.  Respiratory:  Negative for shortness of breath.   Cardiovascular:  Positive for swelling in legs/feet.  Gastrointestinal:  Negative for constipation.  Endocrine: Negative for cold intolerance.  Genitourinary:  Negative for difficulty urinating.  Musculoskeletal:  Positive for joint swelling.  Skin:  Negative for rash.  Allergic/Immunologic: Negative for susceptible to infections.  Neurological:  Negative for numbness.  Hematological:  Negative for bruising/bleeding tendency.  Psychiatric/Behavioral:  Negative for sleep disturbance.    PMFS History:  Patient Active Problem List   Diagnosis Date Noted   Family history of psoriasis 06/14/2020   Psoriatic  arthritis (HCC) 05/29/2020   Psoriasis 05/29/2020    Past Medical History:  Diagnosis Date   Psoriatic arthritis (HCC)     Family History  Problem Relation Age of Onset   Hypertension Mother    Diabetes Father    Parkinson's disease Father    Healthy Brother    Healthy Brother    Hypertension Brother    Healthy Son    Healthy Son    History reviewed. No pertinent surgical history. Social History   Social History Narrative   Not on file   Immunization History  Administered Date(s) Administered   Moderna Sars-Covid-2 Vaccination 04/21/2019, 05/21/2019, 02/10/2020     Objective: Vital Signs: BP (!) 142/94 (BP Location: Left Arm, Patient Position: Sitting, Cuff Size: Normal)   Pulse 69   Resp 14   Ht 5' 9.5" (1.765 m)   Wt 177 lb (80.3 kg)   BMI 25.76 kg/m    Physical Exam Vitals and nursing note reviewed.  Constitutional:      Appearance: He is well-developed.  HENT:     Head: Normocephalic and atraumatic.  Eyes:     Conjunctiva/sclera: Conjunctivae normal.     Pupils: Pupils are equal, round, and reactive to light.  Cardiovascular:     Rate and Rhythm: Normal rate and regular rhythm.     Heart sounds: Normal heart sounds.  Pulmonary:     Effort: Pulmonary effort is normal.     Breath sounds: Normal breath sounds.  Abdominal:     General: Bowel sounds  are normal.     Palpations: Abdomen is soft.  Musculoskeletal:     Cervical back: Normal range of motion and neck supple.  Skin:    General: Skin is warm and dry.     Capillary Refill: Capillary refill takes less than 2 seconds.  Neurological:     Mental Status: He is alert and oriented to person, place, and time.  Psychiatric:        Behavior: Behavior normal.     Musculoskeletal Exam: C-spine thoracic and lumbar spine with good range of motion.  He had no SI joint tenderness.  Shoulder joints, elbow joints, wrist joints with good range of motion.  There was mild tenderness over left wrist joint.  There  was no synovitis over MCPs PIPs or DIPs.  Hip joints with good range of motion.  Knee joints with good range of motion.  He had tenderness and swelling over bilateral ankle joints more prominent in his left ankle.  There was mild tenderness over plantar fascia.  There was no synovitis over MCPs PIPs or DIPs.  CDAI Exam: CDAI Score: -- Patient Global: --; Provider Global: -- Swollen: --; Tender: -- Joint Exam 05/10/2021   No joint exam has been documented for this visit   There is currently no information documented on the homunculus. Go to the Rheumatology activity and complete the homunculus joint exam.  Investigation: No additional findings.  Imaging: No results found.  Recent Labs: Lab Results  Component Value Date   WBC 6.7 04/10/2021   HGB 15.4 04/10/2021   PLT 336 04/10/2021   NA 140 04/10/2021   K 4.5 04/10/2021   CL 104 04/10/2021   CO2 28 04/10/2021   GLUCOSE 96 04/10/2021   BUN 10 04/10/2021   CREATININE 0.90 04/10/2021   BILITOT 0.4 04/10/2021   AST 25 04/10/2021   ALT 34 04/10/2021   PROT 7.4 04/10/2021   CALCIUM 10.2 04/10/2021   GFRAA 127 06/27/2020   QFTBGOLDPLUS NEGATIVE 05/30/2020    Speciality Comments: Dxd in [place] 2018, Started MTX 6/wk then decresed to 4/wk. dcd MTX02/22 restarted Tremfya started 09/06/20  Procedures:  No procedures performed Allergies: Patient has no known allergies.   Assessment / Plan:     Visit Diagnoses: Psoriatic arthritis (HCC) - dxd 2018 in [place].h/o inflammatory arthritis, Achilles tendinitis, plantar fasciitis.  Patient decided to come off methotrexate about 2 months ago as he was concerned about renal and hepatic side effects due to history of renal and liver disease in his family members.  He has been having increased pain and swelling since he has come off methotrexate.  He had swelling in his bilateral ankles today.  He had been on Tremfya since July 2022.  His last Tremfya dose was on April 15, 2021.  He states that  Tremfya has not been effective.  He has been experiencing stiffness in his joints and also in his left wrist.  Different treatment options and their side effects were discussed.  After reviewing indications side effects contraindications we decided to start on Taltz.  Handout was given and consent was obtained.  We will apply for Taltz.  Medication counseling:  Baseline Immunosuppressant Therapy Labs TB GOLD    Latest Ref Rng & Units 05/30/2020    2:19 PM  Quantiferon TB Gold  Quantiferon TB Gold Plus NEGATIVE NEGATIVE     Hepatitis Panel    Latest Ref Rng & Units 05/30/2020    2:19 PM  Hepatitis  Hep B Surface Ag NON-REACTI  NON-REACTIVE    Hep B IgM NON-REACTI NON-REACTIVE    Hep C Ab NON-REACTI NON-REACTIVE     HIV Lab Results  Component Value Date   HIV NON-REACTIVE 05/30/2020   Immunoglobulins    Latest Ref Rng & Units 05/30/2020    2:19 PM  Immunoglobulin Electrophoresis  IgA  47 - 310 mg/dL 295    IgG 284 - 1,324 mg/dL 4,010    IgM 50 - 272 mg/dL 75     SPEP    Latest Ref Rng & Units 04/10/2021    4:05 PM  Serum Protein Electrophoresis  Total Protein 6.1 - 8.1 g/dL 7.4     Does patient have a history of inflammatory bowel disease? No  Counseled patient that Altamease Oiler is a IL-17 inhibitor that works to reduce pain and inflammation associated with arthritis.  Counseled patient on purpose, proper use, and adverse effects of Taltz. Reviewed the most common adverse effects of infection, inflammatory bowel disease, and allergic reaction. Counseled patient that Altamease Oiler should be held for infection and prior to scheduled surgery.  Counseled patient to avoid live vaccines while on Taltz.  Advised patient to get annual influenza vaccine, pneumococcal vaccine, and Shingrix as indicated.  Reviewed storage information for Taltz.  Reviewed the importance of regular labs while on Taltz. Standing orders placed and is to return in 1 month and then every 3 months after initiation.  Provided  patient with medication education material and answered all questions.  Patient consented to Taltz.  Will upload consent into patient's chart.  Will apply for Taltz through patient's insurance and update when we receive a response.  Advised initial injection must be administered in office.  Patient voiced understanding.    Taltz dose will be: For psoriatic arthritis and plaque psoriasis overlap load of 160 mg then 80 mg on weeks 2,4,6,8,10,12 then 80 mg every 28 days  Prescription will be sent to pharmacy pending lab results and insurance approval.   Psoriasis - Diagnosed in 2016.  High risk medication use -we will discontinue Tremfya.  He is currently on Tremfya 100 mg subcu every 8 weeks started on September 06, 2020, (methotrexate 8  have been located. Such creation errors do not reflect on  the standard of medical care.

## 2021-05-07 ENCOUNTER — Ambulatory Visit: Payer: BC Managed Care – PPO | Admitting: Rheumatology

## 2021-05-10 ENCOUNTER — Encounter: Payer: Self-pay | Admitting: Rheumatology

## 2021-05-10 ENCOUNTER — Telehealth: Payer: Self-pay | Admitting: Pharmacist

## 2021-05-10 ENCOUNTER — Ambulatory Visit (INDEPENDENT_AMBULATORY_CARE_PROVIDER_SITE_OTHER): Payer: BC Managed Care – PPO | Admitting: Rheumatology

## 2021-05-10 VITALS — BP 142/94 | HR 69 | Resp 14 | Ht 69.5 in | Wt 177.0 lb

## 2021-05-10 DIAGNOSIS — Z79899 Other long term (current) drug therapy: Secondary | ICD-10-CM

## 2021-05-10 DIAGNOSIS — M79641 Pain in right hand: Secondary | ICD-10-CM

## 2021-05-10 DIAGNOSIS — I10 Essential (primary) hypertension: Secondary | ICD-10-CM

## 2021-05-10 DIAGNOSIS — L4050 Arthropathic psoriasis, unspecified: Secondary | ICD-10-CM

## 2021-05-10 DIAGNOSIS — M79642 Pain in left hand: Secondary | ICD-10-CM

## 2021-05-10 DIAGNOSIS — M79672 Pain in left foot: Secondary | ICD-10-CM

## 2021-05-10 DIAGNOSIS — F439 Reaction to severe stress, unspecified: Secondary | ICD-10-CM

## 2021-05-10 DIAGNOSIS — L409 Psoriasis, unspecified: Secondary | ICD-10-CM

## 2021-05-10 DIAGNOSIS — M79671 Pain in right foot: Secondary | ICD-10-CM

## 2021-05-10 DIAGNOSIS — M7661 Achilles tendinitis, right leg: Secondary | ICD-10-CM

## 2021-05-10 DIAGNOSIS — Z840 Family history of diseases of the skin and subcutaneous tissue: Secondary | ICD-10-CM

## 2021-05-10 NOTE — Progress Notes (Signed)
Pharmacy Note ? ?Subjective:  ?Patient presents today to [facility] for follow up office visit.  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  Prior therapy includes: MTX (stopped due to patient's concern for side effects). He started Tremfya on 09/06/20 and has had waning clinical response. ?His last Tremfya dose was 04/15/21 ? ?History of inflammatory bowel disease: No ? ?Objective:  ?CBC ?   ?Component Value Date/Time  ? WBC 6.7 04/10/2021 1605  ? RBC 5.39 04/10/2021 1605  ? HGB 15.4 04/10/2021 1605  ? HCT 46.9 04/10/2021 1605  ? PLT 336 04/10/2021 1605  ? MCV 87.0 04/10/2021 1605  ? MCH 28.6 04/10/2021 1605  ? MCHC 32.8 04/10/2021 1605  ? RDW 13.2 04/10/2021 1605  ? LYMPHSABS 2,801 04/10/2021 1605  ? EOSABS 436 04/10/2021 1605  ? BASOSABS 101 04/10/2021 1605  ? ? ?CMP  ?   ?Component Value Date/Time  ? NA 140 04/10/2021 1605  ? K 4.5 04/10/2021 1605  ? CL 104 04/10/2021 1605  ? CO2 28 04/10/2021 1605  ? GLUCOSE 96 04/10/2021 1605  ? BUN 10 04/10/2021 1605  ? CREATININE 0.90 04/10/2021 1605  ? CALCIUM 10.2 04/10/2021 1605  ? PROT 7.4 04/10/2021 1605  ? AST 25 04/10/2021 1605  ? ALT 34 04/10/2021 1605  ? BILITOT 0.4 04/10/2021 1605  ? GFRNONAA 110 06/27/2020 1116  ? GFRAA 127 06/27/2020 1116  ? ? ?Baseline Immunosuppressant Therapy Labs ? ? ?  Latest Ref Rng & Units 05/30/2020  ?  2:19 PM  ?Quantiferon TB Gold  ?Quantiferon TB Gold Plus NEGATIVE NEGATIVE    ? ? ? ?  Latest Ref Rng & Units 05/30/2020  ?  2:19 PM  ?Hepatitis  ?Hep B Surface Ag NON-REACTI NON-REACTIVE    ?Hep B IgM NON-REACTI NON-REACTIVE    ?Hep C Ab NON-REACTI NON-REACTIVE    ? ? ?Lab Results  ?Component Value Date  ? HIV NON-REACTIVE 05/30/2020  ? ? ? ?  Latest Ref Rng & Units 05/30/2020  ?  2:19 PM  ?Immunoglobulin Electrophoresis  ?IgA  47 - 310 mg/dL 433    ?IgG 600 - 1,640 mg/dL 2,951    ?IgM 50 - 300 mg/dL 75    ? ? ? ?  Latest Ref Rng & Units 04/10/2021  ?  4:05 PM  ?Serum  Protein Electrophoresis  ?Total Protein 6.1 - 8.1 g/dL 7.4    ? ? ?No results found for: G6PDH ? ?No results found for: TPMT ? ?Chest Xray:05/29/20 - Normal chest x-ray. ? ?Assessment/Plan:  ?Counseled patient that Altamease Oiler is a IL-17 inhibitor that works to reduce pain and inflammation associated with arthritis.  Counseled patient on purpose, proper use, and adverse effects of Taltz. Reviewed the most common adverse effects of infection (more commonly nasopharyngitis, URTI), inflammatory bowel disease, and allergic reaction. Counseled patient that Altamease Oiler should be held for infection and prior to scheduled surgery.  Counseled patient to avoid live vaccines while on Taltz. Recommend annual influenza, PCV 15 or PCV20 or Pneumovax 23, and Shingrix as indicated. Reviewed storage information for Taltz. ? ?Reviewed the importance of regular labs while on Taltz. Will monitor CBC and CMP 1 month after starting and every 3 months routinely thereafter. Will monitor TB gold annually. Standing orders placed. Provided patient with medication education material and answered all questions.  Patient consented to Taltz.  Will upload consent into patient's chart.  Will apply for Taltz through [employer] and  update when we receive a response.  Advised initial injection must be administered in office.  Patient voiced understanding.   ? ?Taltz dose will be: For psoriatic arthritis and plaque psoriasis overlap load of 160 mg then 80 mg on weeks 2,4,6,8,10,12 then 80 mg every 28 days  Prescription will be sent to pharmacy pending lab results and insurance approval.  ? ?His last Tremfya dose was 04/15/21 so he will be able to start Taltz on or after 06/11/21 ? ?Chesley Mires, PharmD, MPH, BCPS ?Clinical Pharmacist (Rheumatology and Pulmonology) ?

## 2021-05-10 NOTE — Telephone Encounter (Signed)
Please start Taltz BIV. ? ?Dose: For psoriatic arthritis and plaque psoriasis overlap load of 160 mg then 80 mg on weeks 2,4,6,8,10,12 then 80 mg every 28 days   ? ?Dx: Psoriatic arthritis (L40.5) and Psoriasis (L40.9) ? ?Previously tried therapies: ?Tremfya - started July 2022 - waning clinical response ?MTX - patient stopped due to side effects ? ?Patient eligible for copay card once approved through insurance. ? ?He is able to start Taltz on or after 06/11/21 ? ?Chesley Mires, PharmD, MPH, BCPS ?Clinical Pharmacist (Rheumatology and Pulmonology) ? ?

## 2021-05-10 NOTE — Patient Instructions (Addendum)
If you have signs or symptoms of an infection or start antibiotics: ?First, call your PCP for workup of your infection. ?Hold your medication through the infection, until you complete your antibiotics, and until symptoms resolve if you take the following: ?Injectable medication (Actemra, Benlysta, Cimzia, Cosentyx, Enbrel, Humira, Kevzara, Orencia, Remicade, Simponi, [place]ara, [place], [place]fya) ?Methotrexate ?Leflunomide Ranae Plumber(Arava) ?Mycophenolate (Cellcept) ?Osborne OmanXeljanz, Olumiant, or Rinvoq  ? ?Vaccines ?You are taking a medication(s) that can suppress your immune system.  The following immunizations are recommended: ?Flu annually ?Covid-19  ?Td/Tdap (tetanus, diphtheria, pertussis) every 10 years ?Pneumonia (Prevnar 15 then Pneumovax 23 at least 1 year apart.  Alternatively, can take Prevnar 20 without needing additional dose) ?Shingrix: 2 doses from 4 weeks to 6 months apart ? ?Please check with your PCP to make sure you are up to date. ? ?Standing Labs ?We placed an order today for your standing lab work.  ? ?Please have your standing labs drawn in 1 month after starting Taltz then every 3 months ? ?If possible, please have your labs drawn 2 weeks prior to your appointment so that the provider can discuss your results at your appointment. ? ?Please note that you may see your imaging and lab results in MyChart before we have reviewed them. ?We may be awaiting multiple results to interpret others before contacting you. ?Please allow our office up to 72 hours to thoroughly review all of the results before contacting the office for clarification of your results. ? ?We have open lab daily: ?Monday through Thursday from 1:30-4:30 PM and Friday from 1:30-4:00 PM ?at the office of Dr. Pollyann SavoyShaili Deveshwar, [facility].   ?Please be advised, all patients with office appointments requiring lab work will take precedent over walk-in lab work.  ?If possible, please come for your lab work on Monday and Friday afternoons, as you  may experience shorter wait times. ?The office is located at [address], [address], [place], [place] [id number] ?No appointment is necessary.   ?Labs are drawn by [facility]. Please bring your co-pay at the time of your lab draw.  You may receive a bill from [facility] for your lab work. ? ?Please note if you are on Hydroxychloroquine and and an order has been placed for a Hydroxychloroquine level, you will need to have it drawn 4 hours or more after your last dose. ? ?If you wish to have your labs drawn at another location, please call the office 24 hours in advance to send orders. ? ?If you have any questions regarding directions or hours of operation,  ?please call [phone].   ?As a reminder, please drink plenty of water prior to coming for your lab work. Thanks! ? ? ? ?Ixekizumab injection ?What is this medication? ?IXEKIZUMAB (ix e KIZ ue mab) is used to treat plaque psoriasis, psoriatic arthritis, ankylosing spondylitis, and active non-radiographic axial spondyloarthritis. ?This medicine may be used for other purposes; ask your health care provider or pharmacist if you have questions. ?COMMON BRAND NAME(S): TALTZ ?What should I tell my care team before I take this medication? ?They need to know if you have any of these conditions: ?immune system problems ?infection (especially a viral infection such as chickenpox, cold sores, or herpes) ?recently received or are scheduled to receive a vaccine ?tuberculosis, a positive skin test for tuberculosis, or have recently been in close contact with someone who has tuberculosis ?an unusual or allergic reaction to ixekizumab, other medicines, foods, dyes or preservatives ?pregnant or trying to get pregnant ?breast-feeding ?How should  I use this medication? ?This medicine is for injection under the skin. It may be administered by a healthcare professional in a hospital or clinic setting or at home. If you get this medicine at home, you will be taught how to prepare and give  this medicine. Use exactly as directed. Take your medicine at regular intervals. Do not take your medicine more often than directed. ?It is important that you put your used needles and syringes in a special sharps container. Do not put them in a trash can. If you do not have a sharps container, call your pharmacist or healthcare provider to get one. ?A special MedGuide will be given to you by the pharmacist with each prescription and refill. Be sure to read this information carefully each time. ?Talk to your pediatrician regarding the use of this medicine in children. While this drug may be prescribed for children as young as 6 years for selected conditions, precautions do apply. ?Overdosage: If you think you have taken too much of this medicine contact a poison control center or emergency room at once. ?NOTE: This medicine is only for you. Do not share this medicine with others. ?What if I miss a dose? ?It is important not to miss your dose. Call your doctor of health care professional if you are unable to keep an appointment. If you give yourself the medicine and you miss a dose, take it as soon as you can. Then be sure to take your next doses on your regular schedule. Do not take double or extra doses. If you have questions about a missed injection, call your health care professional. ?What may interact with this medication? ?Do not take this medicine with any of the following medications: ?live virus vaccines ?This medicine may also interact with the following medications: ?inactivated vaccines ?This list may not describe all possible interactions. Give your health care provider a list of all the medicines, herbs, non-prescription drugs, or dietary supplements you use. Also tell them if you smoke, drink alcohol, or use illegal drugs. Some items may interact with your medicine. ?What should I watch for while using this medication? ?Tell your doctor or healthcare professional if your symptoms do not start to get  better or if they get worse. ?You will be tested for tuberculosis (TB) before you start this medicine. If your doctor prescribes any medicine for TB, you should start taking the TB medicine before starting this medicine. Make sure to finish the full course of TB medicine. ?Call your doctor or healthcare professional for advice if you get a fever, chills or sore throat, or other symptoms of a cold or flu. Do not treat yourself. This drug decreases your body's ability to fight infections. Try to avoid being around people who are sick. ?This medicine can decrease the response to a vaccine. If you need to get vaccinated, tell your healthcare professional if you have received this medicine within the last 6 months. Extra booster doses may be needed. Talk to your doctor to see if a different vaccination schedule is needed. ?What side effects may I notice from receiving this medication? ?Side effects that you should report to your doctor or health care professional as soon as possible: ?allergic reactions like skin rash, itching or hives, swelling of the face, lips, or tongue ?signs and symptoms of infection like fever or chills; cough; sore throat; pain or trouble passing urine ?signs and symptoms of bowel problems like abdominal pain, diarrhea, blood in the stool, and weight loss ?white  patches in the mouth or throat ?vaginal discharge, itching, or odor in women ?Side effects that usually do not require medical attention (report to your doctor or health care professional if they continue or are bothersome): ?nausea ?runny nose ?sinus trouble ?This list may not describe all possible side effects. Call your doctor for medical advice about side effects. You may report side effects to FDA at 1-800-FDA-1088. ?Where should I keep my medication? ?Keep out of the reach of children. ?Store the prefilled syringe or injection pen in a refrigerator between 2 to 8 degrees C (36 to 46 degrees F). Keep the syringe or the pen in the  original carton until ready for use. Protect from light. Do not freeze. Do not shake. Prior to use, remove the syringe or pen from the refrigerator and use within 30 minutes. Throw away any unused medicin

## 2021-05-11 ENCOUNTER — Other Ambulatory Visit (HOSPITAL_COMMUNITY): Payer: Self-pay

## 2021-05-11 NOTE — Telephone Encounter (Signed)
Submitted a Prior Authorization request to [employer] for TALTZ via CoverMyMeds. Will update once we receive a response. ? ?Key: [id number] ? ?Chesley Mires, PharmD, MPH, BCPS ?Clinical Pharmacist (Rheumatology and Pulmonology) ?

## 2021-05-11 NOTE — Telephone Encounter (Signed)
Per automated CMM response, drug is covered by current benefit plan. No further PA activity needed. ? ?Unable to run test claim as patient is locked into [facility]. He can start Taltz on or after 06/11/21 ? ?Chesley Mires, PharmD, MPH, BCPS ?Clinical Pharmacist (Rheumatology and Pulmonology) ? ? ?

## 2021-05-15 NOTE — Telephone Encounter (Signed)
ATC patient to schedule Taltz new start visit on or after 06/11/21. Unable to reach - left VM requesting return call ? ?Chesley Mires, PharmD, MPH, BCPS ?Clinical Pharmacist (Rheumatology and Pulmonology) ?

## 2021-05-16 NOTE — Telephone Encounter (Signed)
Taltz new start visit scheduled for 06/14/21. ? ?Chesley Mires, PharmD, MPH, BCPS ?Clinical Pharmacist (Rheumatology and Pulmonology) ?

## 2021-06-13 NOTE — Progress Notes (Signed)
Pharmacy Note ? ?Subjective:   ?Patient presents to clinic today to receive first dose of Taltz for PsA/psoriasis. Patient tried methotrexate in past but stopped due to side effects. Started Tremfya in July 2022 but had waning response. His last Tremfya dose was 04/15/21.  ? ?He does note that he has had decreased inflammation that last few weeks and thinks that alcohol use (socially) may worsen it. ? ?Patient running a fever or have signs/symptoms of infection? No ? ?Patient currently on antibiotics for the treatment of infection? No ? ?Patient have any upcoming invasive procedures/surgeries? No ? ?Objective: ?CMP  ?   ?Component Value Date/Time  ? NA 140 04/10/2021 1605  ? K 4.5 04/10/2021 1605  ? CL 104 04/10/2021 1605  ? CO2 28 04/10/2021 1605  ? GLUCOSE 96 04/10/2021 1605  ? BUN 10 04/10/2021 1605  ? CREATININE 0.90 04/10/2021 1605  ? CALCIUM 10.2 04/10/2021 1605  ? PROT 7.4 04/10/2021 1605  ? AST 25 04/10/2021 1605  ? ALT 34 04/10/2021 1605  ? BILITOT 0.4 04/10/2021 1605  ? GFRNONAA 110 06/27/2020 1116  ? GFRAA 127 06/27/2020 1116  ? ? ?CBC ?   ?Component Value Date/Time  ? WBC 6.7 04/10/2021 1605  ? RBC 5.39 04/10/2021 1605  ? HGB 15.4 04/10/2021 1605  ? HCT 46.9 04/10/2021 1605  ? PLT 336 04/10/2021 1605  ? MCV 87.0 04/10/2021 1605  ? MCH 28.6 04/10/2021 1605  ? MCHC 32.8 04/10/2021 1605  ? RDW 13.2 04/10/2021 1605  ? LYMPHSABS 2,801 04/10/2021 1605  ? EOSABS 436 04/10/2021 1605  ? BASOSABS 101 04/10/2021 1605  ? ? ?Baseline Immunosuppressant Therapy Labs ?TB GOLD ? ?  Latest Ref Rng & Units 05/30/2020  ?  2:19 PM  ?Quantiferon TB Gold  ?Quantiferon TB Gold Plus NEGATIVE NEGATIVE    ? ?Hepatitis Panel ? ?  Latest Ref Rng & Units 05/30/2020  ?  2:19 PM  ?Hepatitis  ?Hep B Surface Ag NON-REACTI NON-REACTIVE    ?Hep B IgM NON-REACTI NON-REACTIVE    ?Hep C Ab NON-REACTI NON-REACTIVE    ? ?HIV ?Lab Results  ?Component Value Date  ? HIV NON-REACTIVE 05/30/2020  ? ?Immunoglobulins ? ?  Latest Ref Rng & Units 05/30/2020   ?  2:19 PM  ?Immunoglobulin Electrophoresis  ?IgA  47 - 310 mg/dL 381    ?IgG 600 - 1,640 mg/dL 8,299    ?IgM 50 - 300 mg/dL 75    ? ?SPEP ? ?  Latest Ref Rng & Units 04/10/2021  ?  4:05 PM  ?Serum Protein Electrophoresis  ?Total Protein 6.1 - 8.1 g/dL 7.4    ? ?Chest x-ray: 05/29/20 - Normal chest x-ray. ? ?Assessment/Plan:  ?Demonstrated proper injection technique with Taltz demo device  Patient able to demonstrate proper injection technique using the teach back method.  Patient self injected in the right and left abdomen with: ? ?Sample Medication: Taltz 80mg /ml autoinjector pen x 2 pens = 160mg  total dose ?Lot: DG ?Expiration: 10/11/2022 ? ?Patient tolerated well.  Observed for 30 mins in office for adverse reaction and none noted.  ? ?Patient is to return in 1 month for labs and 6-8 weeks for follow-up appointment.  Standing orders placed.  ? ?Taltz approved through insurance .   Rx sent to: [facility]: [phone].  Patient provided with pharmacy phone number and advised to call later this week to schedule shipment to home. ? ?He will continue: 160mg  at Week 0 (administered in clinic today), then 80mg  at  Weeks 2, 4, 6, 8, 10, and 12 then 80mg  SQ every 4 weeks thereafter. ? ?These doses will be 06/14/21 (160mg  administered today), then 06/28/21, 07/12/21, 07/26/21, 08/09/21, 08/23/21, 09/06/21), then every 4 weeks thereafter (starting on 10/04/21) ? ?All questions encouraged and answered.  Instructed patient to call with any further questions or concerns. ? ?09/08/21, PharmD, MPH, BCPS, CPP ?Clinical Pharmacist (Rheumatology and Pulmonology) ? ?06/13/2021 11:15 AM ?

## 2021-06-13 NOTE — Patient Instructions (Addendum)
Your next TALTZ dose is due on 06/28/21, 07/12/21, 07/26/21, 08/09/21, 08/23/21, 09/06/21, then every 4 weeks thereafter (starting on 10/04/21) ? ?HOLD TALTZ if you have signs or symptoms of an infection. You can resume once you feel better or back to your baseline. ?HOLD TALTZ if you start antibiotics to treat an infection. ?HOLD TALTZ around the time of surgery/procedures. Your surgeon will be able to provide recommendations on when to hold BEFORE and when you are cleared to RESUME. ? ?Pharmacy information: ?Your prescription will be shipped from [facility]. ?Their phone number is [phone] ?Please call to schedule shipment and confirm address. They will mail your medication to your home. ? ?Cost information: ?Your copay should be affordable. If you call the pharmacy and it is not affordable, please double-check that they are billing through your copay card as secondary coverage. ?That copay card information is: ?BIN: [id number] ?PCN: [id number] ?GROUP: [id number] ?ID: [id number] ?Expiration Date: 02/11/2024 ? ?Labs are due in 1 month then every 3 months. ?Lab hours are from Monday to Thursday 1:30-4:30pm and Friday 1:30-4pm. You do not need an appointment if you come for labs during these times. ? ?How to manage an injection site reaction: ?Remember the 5 C's: ?COUNTER - leave on the counter at least 30 minutes but up to overnight to bring medication to room temperature. This may help prevent stinging ?COLD - place something cold (like an ice gel pack or cold water bottle) on the injection site just before cleansing with alcohol. This may help reduce pain ?CLARITIN - use Claritin (generic name is loratadine) for the first two weeks of treatment or the day of, the day before, and the day after injecting. This will help to minimize injection site reactions ?CORTISONE CREAM - apply if injection site is irritated and itching ?CALL ME - if injection site reaction is bigger than the size of your fist, looks infected,  blisters, or if you develop hives ?

## 2021-06-14 ENCOUNTER — Ambulatory Visit: Payer: BC Managed Care – PPO | Admitting: Pharmacist

## 2021-06-14 VITALS — BP 157/105 | HR 69

## 2021-06-14 DIAGNOSIS — L409 Psoriasis, unspecified: Secondary | ICD-10-CM

## 2021-06-14 DIAGNOSIS — Z79899 Other long term (current) drug therapy: Secondary | ICD-10-CM

## 2021-06-14 DIAGNOSIS — Z7189 Other specified counseling: Secondary | ICD-10-CM

## 2021-06-14 DIAGNOSIS — L4050 Arthropathic psoriasis, unspecified: Secondary | ICD-10-CM

## 2021-06-14 MED ORDER — TALTZ 80 MG/ML ~~LOC~~ SOAJ: SUBCUTANEOUS | 0 refills | Status: DC

## 2021-06-14 NOTE — Telephone Encounter (Signed)
Taltz copay card activated at new start visit today ? ?RXBIN: [id number] ?PCN: [id number] ?[id number] ?ID: [id number] ?Expiration Date: 02/11/2024 ? ?Knox Saliva, PharmD, MPH, BCPS, CPP ?Clinical Pharmacist (Rheumatology and Pulmonology) ?

## 2021-07-13 NOTE — Progress Notes (Signed)
Office Visit Note  Patient: Tyler Matthews             Date of Birth: 1980/08/23           MRN: [id number]             PCP: Brynda Greathouse, MD Referring: Jearld Adjutant * Visit Date: 07/27/2021 Occupation: @GUAROCC @  Subjective:  Medication monitoring  History of Present Illness: Azir Muzyka is a 41 y.o. male with history of psoriatic arthritis and psoriasis.  He states he has been taking Taltz on a regular basis.  Since he has been on Taltz he has not noticed any joint pain or joint swelling.  He notices some fluid retention in his left lower extremity.  He denies any psoriasis lesions.  Activities of Daily Living:  Patient reports morning stiffness for 0  none .   Patient Denies nocturnal pain.  Difficulty dressing/grooming: Denies Difficulty climbing stairs: Denies Difficulty getting out of chair: Denies Difficulty using hands for taps, buttons, cutlery, and/or writing: Denies  Review of Systems  Constitutional:  Negative for fatigue.  HENT:  Negative for mouth dryness.   Eyes:  Negative for dryness.  Respiratory:  Negative for shortness of breath.   Cardiovascular:  Negative for swelling in legs/feet.  Gastrointestinal:  Negative for constipation.  Endocrine: Negative for excessive thirst.  Genitourinary:  Negative for difficulty urinating.  Musculoskeletal:  Positive for joint swelling.  Skin:  Negative for rash.  Allergic/Immunologic: Negative for susceptible to infections.  Neurological:  Negative for numbness.  Hematological:  Negative for bruising/bleeding tendency.  Psychiatric/Behavioral:  Negative for sleep disturbance.     PMFS History:  Patient Active Problem List   Diagnosis Date Noted   Essential hypertension 07/27/2021   Family history of psoriasis 06/14/2020   Psoriatic arthritis (HCC) 05/29/2020   Psoriasis 05/29/2020    Past Medical History:  Diagnosis Date   Psoriatic arthritis (HCC)     Family History  Problem Relation  Age of Onset   Hypertension Mother    Diabetes Father    Parkinson's disease Father    Healthy Brother    Healthy Brother    Hypertension Brother    Healthy Son    Healthy Son    History reviewed. No pertinent surgical history. Social History   Social History Narrative   Not on file   Immunization History  Administered Date(s) Administered   Moderna Sars-Covid-2 Vaccination 04/21/2019, 05/21/2019, 02/10/2020     Objective: Vital Signs: BP (!) 142/96 (BP Location: Left Arm, Patient Position: Sitting, Cuff Size: Small)   Pulse 65   Resp 12   Ht 5\' 9"  (1.753 m)   Wt 175 lb 12.8 oz (79.7 kg)   BMI 25.96 kg/m    Physical Exam Vitals and nursing note reviewed.  Constitutional:      Appearance: He is well-developed.  HENT:     Head: Normocephalic and atraumatic.  Eyes:     Conjunctiva/sclera: Conjunctivae normal.     Pupils: Pupils are equal, round, and reactive to light.  Cardiovascular:     Rate and Rhythm: Normal rate and regular rhythm.     Heart sounds: Normal heart sounds.  Pulmonary:     Effort: Pulmonary effort is normal.     Breath sounds: Normal breath sounds.  Abdominal:     General: Bowel sounds are normal.     Palpations: Abdomen is soft.  Musculoskeletal:     Cervical back: Normal range of motion and neck supple.  Left lower leg: Edema present.  Skin:    General: Skin is warm and dry.     Capillary Refill: Capillary refill takes less than 2 seconds.  Neurological:     Mental Status: He is alert and oriented to person, place, and time.  Psychiatric:        Behavior: Behavior normal.      Musculoskeletal Exam: C-spine was in good range of motion.  He had no tenderness over SI joints.  Shoulder joints, elbow joints, wrist joints, MCPs PIPs and DIPs with good range of motion with no synovitis.  Hip joints and knee joints in good range of motion.  He had edema on his left lower extremity but no synovitis was noted.  Ankle joints and MTPs with good  range of motion with no tenderness.  There was no evidence of Planter fasciitis or Achilles tendinitis.  CDAI Exam: CDAI Score: -- Patient Global: --; Provider Global: -- Swollen: --; Tender: -- Joint Exam 07/27/2021   No joint exam has been documented for this visit   There is currently no information documented on the homunculus. Go to the Rheumatology activity and complete the homunculus joint exam.  Investigation: No additional findings.  Imaging: No results found.  Recent Labs: Lab Results  Component Value Date   WBC 6.7 04/10/2021   HGB 15.4 04/10/2021   PLT 336 04/10/2021   NA 140 04/10/2021   K 4.5 04/10/2021   CL 104 04/10/2021   CO2 28 04/10/2021   GLUCOSE 96 04/10/2021   BUN 10 04/10/2021   CREATININE 0.90 04/10/2021   BILITOT 0.4 04/10/2021   AST 25 04/10/2021   ALT 34 04/10/2021   PROT 7.4 04/10/2021   CALCIUM 10.2 04/10/2021   GFRAA 127 06/27/2020   QFTBGOLDPLUS NEGATIVE 05/30/2020     Speciality Comments: Dxd in [place] 2018, Started MTX 6/wk then decresed to 4/wk. dcd MTX02/22 restarted Tremfya started 09/06/20-04/15/21, Taltz started 06/14/21  Procedures:  No procedures performed Allergies: Patient has no known allergies.   Assessment / Plan:     Visit Diagnoses: Psoriatic arthritis (HCC) - dxd 2018 in [place].h/o inflammatory arthritis, Achilles tendinitis, plantar fasciitis.  Patient has been doing much better on Taltz.  He has had no episodes of Achilles tendinitis or planter fasciitis.  He notices some swelling on his left ankle due to pedal edema.  He denies any joint pain.  Psoriasis-there were no psoriasis lesions on the examination.  High risk medication use - Taltz 80 mg every 28 days, previously on tremfya.Labs from PCPs office were reviewed: July 25, 2021 CBC WBC 5.1, hemoglobin 14.6, platelets 303, CMP ALT 43 AST 21, creatinine 0.97, hemoglobin A1c 5.9, LDL 121, TSH 2.15, UA negative.  We will get TB Gold today.  He was advised to get labs  every 3 months to monitor for drug toxicity.  Information on immunization was placed in the AVS.  He was also advised to hold Taltz if he develops an infection and resume after the infection resolves.  Essential hypertension-his blood pressure was still elevated on amlodipine 5 mg p.o. daily.  Advised him to discuss this further with his PCP.  Pedal edema-pitting edema was noted on the left lower extremity.  He will see his PCP.  Orders: No orders of the defined types were placed in this encounter.  No orders of the defined types were placed in this encounter.    Follow-Up Instructions: Return in about 5 months (around 12/27/2021) for Psoriatic arthritis.   Pollyann Savoy,  MD  Note - This record has been created using Editor, commissioning.  Chart creation errors have been sought, but may not always  have been located. Such creation errors do not reflect on  the standard of medical care.

## 2021-07-27 ENCOUNTER — Ambulatory Visit (INDEPENDENT_AMBULATORY_CARE_PROVIDER_SITE_OTHER): Payer: BC Managed Care – PPO | Admitting: Rheumatology

## 2021-07-27 ENCOUNTER — Encounter: Payer: Self-pay | Admitting: Rheumatology

## 2021-07-27 VITALS — BP 142/96 | HR 65 | Resp 12 | Ht 69.0 in | Wt 175.8 lb

## 2021-07-27 DIAGNOSIS — R600 Localized edema: Secondary | ICD-10-CM

## 2021-07-27 DIAGNOSIS — L4050 Arthropathic psoriasis, unspecified: Secondary | ICD-10-CM | POA: Diagnosis not present

## 2021-07-27 DIAGNOSIS — Z79899 Other long term (current) drug therapy: Secondary | ICD-10-CM

## 2021-07-27 DIAGNOSIS — Z111 Encounter for screening for respiratory tuberculosis: Secondary | ICD-10-CM

## 2021-07-27 DIAGNOSIS — Z9225 Personal history of immunosupression therapy: Secondary | ICD-10-CM

## 2021-07-27 DIAGNOSIS — L409 Psoriasis, unspecified: Secondary | ICD-10-CM | POA: Diagnosis not present

## 2021-07-27 DIAGNOSIS — I10 Essential (primary) hypertension: Secondary | ICD-10-CM

## 2021-07-27 NOTE — Patient Instructions (Signed)
Standing Labs We placed an order today for your standing lab work.   Please have your standing labs drawn in September and every 3 months  If possible, please have your labs drawn 2 weeks prior to your appointment so that the provider can discuss your results at your appointment.  Please note that you may see your imaging and lab results in MyChart before we have reviewed them. We may be awaiting multiple results to interpret others before contacting you. Please allow our office up to 72 hours to thoroughly review all of the results before contacting the office for clarification of your results.  We have open lab daily: Monday through Thursday from 1:30-4:30 PM and Friday from 1:30-4:00 PM at the office of Dr. Jeneal Vogl, [facility].   Please be advised, all patients with office appointments requiring lab work will take precedent over walk-in lab work.  If possible, please come for your lab work on Monday and Friday afternoons, as you may experience shorter wait times. The office is located at [address], [address], [place],  [address] No appointment is necessary.   Labs are drawn by [facility]. Please bring your co-pay at the time of your lab draw.  You may receive a bill from [facility] for your lab work.  Please note if you are on Hydroxychloroquine and and an order has been placed for a Hydroxychloroquine level, you will need to have it drawn 4 hours or more after your last dose.  If you wish to have your labs drawn at another location, please call the office 24 hours in advance to send orders.  If you have any questions regarding directions or hours of operation,  please call [phone].   As a reminder, please drink plenty of water prior to coming for your lab work. Thanks!   Vaccines You are taking a medication(s) that can suppress your immune system.  The following immunizations are recommended: Flu annually Covid-19  Td/Tdap (tetanus, diphtheria,  pertussis) every 10 years Pneumonia (Prevnar 15 then Pneumovax 23 at least 1 year apart.  Alternatively, can take Prevnar 20 without needing additional dose) Shingrix: 2 doses from 4 weeks to 6 months apart  Please check with your PCP to make sure you are up to date.   If you have signs or symptoms of an infection or start antibiotics: First, call your PCP for workup of your infection. Hold your medication through the infection, until you complete your antibiotics, and until symptoms resolve if you take the following: Injectable medication (Actemra, Benlysta, Cimzia, Cosentyx, Enbrel, Humira, Kevzara, Orencia, Remicade, Simponi, Stelara, Taltz, Tremfya) Methotrexate Leflunomide (Arava) Mycophenolate (Cellcept) Xeljanz, Olumiant, or Rinvoq  

## 2021-08-03 LAB — QUANTIFERON-TB GOLD PLUS
Mitogen-NIL: >10 IU/mL
NIL: 0.03 IU/mL
QuantiFERON-TB Gold Plus: NEGATIVE
TB1-NIL: 0.01 IU/mL
TB2-NIL: 0.01 IU/mL

## 2021-08-29 ENCOUNTER — Other Ambulatory Visit: Payer: Self-pay | Admitting: *Deleted

## 2021-08-29 DIAGNOSIS — Z79899 Other long term (current) drug therapy: Secondary | ICD-10-CM

## 2021-08-29 DIAGNOSIS — L409 Psoriasis, unspecified: Secondary | ICD-10-CM

## 2021-08-29 DIAGNOSIS — L4050 Arthropathic psoriasis, unspecified: Secondary | ICD-10-CM

## 2021-08-29 MED ORDER — TALTZ 80 MG/ML ~~LOC~~ SOAJ: 80.0000 mg | SUBCUTANEOUS | 0 refills | Status: DC

## 2021-08-29 NOTE — Telephone Encounter (Signed)
Refill request received via fax from [facility] for Taltz  Next Visit: 01/01/2022  Last Visit: 07/27/2021  Last Fill: 06/14/2021  WC:[id number] arthritis   Current Dose per office note 07/27/2021: Taltz 80 mg every 28 days  Labs: 07/25/2021 CBC WBC 5.1, hemoglobin 14.6, platelets 303, CMP ALT 43 AST 21, creatinine 0.97  TB Gold: 07/27/2021 Neg   Okay to refill Taltz?

## 2021-11-19 ENCOUNTER — Other Ambulatory Visit: Payer: Self-pay | Admitting: Rheumatology

## 2021-11-19 DIAGNOSIS — L409 Psoriasis, unspecified: Secondary | ICD-10-CM

## 2021-11-19 DIAGNOSIS — Z79899 Other long term (current) drug therapy: Secondary | ICD-10-CM

## 2021-11-19 DIAGNOSIS — L4050 Arthropathic psoriasis, unspecified: Secondary | ICD-10-CM

## 2021-11-19 NOTE — Telephone Encounter (Signed)
Next Visit: 01/01/2022   Last Visit: 07/27/2021   Last Fill: 08/29/2021   VW:[id number] arthritis    Current Dose per office note 07/27/2021: Taltz 80 mg every 28 days   Labs: 07/25/2021 CBC WBC 5.1, hemoglobin 14.6, platelets 303, CMP ALT 43 AST 21, creatinine 0.97   TB Gold: 07/27/2021 Neg   Patient advised he is due to update labs. Patient will update labs this week.    Okay to refill Taltz?

## 2021-11-22 ENCOUNTER — Other Ambulatory Visit: Payer: Self-pay

## 2021-11-22 DIAGNOSIS — Z79899 Other long term (current) drug therapy: Secondary | ICD-10-CM

## 2021-11-23 LAB — COMPLETE METABOLIC PANEL WITH GFR
AG Ratio: 1.6 (calc) (ref 1.0–2.5)
ALT: 29 U/L (ref 9–46)
AST: 20 U/L (ref 10–40)
Albumin: 4.4 g/dL (ref 3.6–5.1)
Alkaline phosphatase (APISO): 77 U/L (ref 36–130)
BUN: 13 mg/dL (ref 7–25)
CO2: 30 mmol/L (ref 20–32)
Calcium: 9.8 mg/dL (ref 8.6–10.3)
Chloride: 103 mmol/L (ref 98–110)
Creat: 0.84 mg/dL (ref 0.60–1.29)
Globulin: 2.8 g/dL (calc) (ref 1.9–3.7)
Glucose, Bld: 104 mg/dL — ABNORMAL HIGH (ref 65–99)
Potassium: 4.5 mmol/L (ref 3.5–5.3)
Sodium: 140 mmol/L (ref 135–146)
Total Bilirubin: 0.4 mg/dL (ref 0.2–1.2)
Total Protein: 7.2 g/dL (ref 6.1–8.1)
eGFR: 113 mL/min/{1.73_m2} (ref >=60)

## 2021-11-23 LAB — CBC WITH DIFFERENTIAL/PLATELET
Absolute Monocytes: 541 cells/uL (ref 200–950)
Basophils Absolute: 99 cells/uL (ref 0–200)
Basophils Relative: 1.5 %
Eosinophils Absolute: 449 cells/uL (ref 15–500)
Eosinophils Relative: 6.8 %
HCT: 44.4 % (ref 38.5–50.0)
Hemoglobin: 15.2 g/dL (ref 13.2–17.1)
Lymphs Abs: 2350 cells/uL (ref 850–3900)
MCH: 29.3 pg (ref 27.0–33.0)
MCHC: 34.2 g/dL (ref 32.0–36.0)
MCV: 85.5 fL (ref 80.0–100.0)
MPV: 10.2 fL (ref 7.5–12.5)
Monocytes Relative: 8.2 %
Neutro Abs: 3161 cells/uL (ref 1500–7800)
Neutrophils Relative %: 47.9 %
Platelets: 311 10*3/uL (ref 140–400)
RBC: 5.19 10*6/uL (ref 4.20–5.80)
RDW: 13.1 % (ref 11.0–15.0)
Total Lymphocyte: 35.6 %
WBC: 6.6 10*3/uL (ref 3.8–10.8)

## 2021-11-23 NOTE — Progress Notes (Signed)
CBC and CMP WNL

## 2021-12-06 ENCOUNTER — Other Ambulatory Visit: Payer: Self-pay | Admitting: Physician Assistant

## 2021-12-06 DIAGNOSIS — Z79899 Other long term (current) drug therapy: Secondary | ICD-10-CM

## 2021-12-06 DIAGNOSIS — L4050 Arthropathic psoriasis, unspecified: Secondary | ICD-10-CM

## 2021-12-06 DIAGNOSIS — L409 Psoriasis, unspecified: Secondary | ICD-10-CM

## 2021-12-06 NOTE — Telephone Encounter (Signed)
Next Visit: 01/01/2022  Last Visit: 07/27/2021  Last Fill: 11/19/2021  DX: Psoriatic arthritis   Current Dose per office note 07/27/2021: Taltz 80 mg every 28 days  Labs: 11/22/2021 CBC and CMP WNL  TB Gold: 07/27/2021   TB gold is negative.  Okay to refill Taltz?

## 2021-12-10 ENCOUNTER — Other Ambulatory Visit: Payer: Self-pay | Admitting: Physician Assistant

## 2021-12-10 DIAGNOSIS — L4050 Arthropathic psoriasis, unspecified: Secondary | ICD-10-CM

## 2021-12-10 DIAGNOSIS — L409 Psoriasis, unspecified: Secondary | ICD-10-CM

## 2021-12-10 DIAGNOSIS — Z79899 Other long term (current) drug therapy: Secondary | ICD-10-CM

## 2021-12-18 NOTE — Progress Notes (Signed)
Office Visit Note  Patient: Tyler Matthews             Date of Birth: 1980-04-16           MRN: [id number]             PCP: Merrilee Seashore, MD Referring: Pollie Meyer * Visit Date: 01/01/2022 Occupation: @GUAROCC @  Subjective:  Medication management  History of Present Illness: Tyler Matthews is a 41 y.o. male with history of psoriatic arthritis and psoriasis.  He states he has been taking Taltz since May 2023 on a regular basis.  He is currently on Taltz 80 mg subcu every 28 days.  He has not noticed any psoriasis.  He denies any joint pain or joint swelling.  He denies any history of dactylitis, uveitis, Planter fasciitis or Achilles tendinitis.  Activities of Daily Living:  Patient reports morning stiffness for 0 minutes.   Patient Denies nocturnal pain.  Difficulty dressing/grooming: Denies Difficulty climbing stairs: Denies Difficulty getting out of chair: Denies Difficulty using hands for taps, buttons, cutlery, and/or writing: Denies  Review of Systems  Constitutional:  Negative for fatigue.  HENT:  Negative for mouth sores and mouth dryness.   Eyes:  Negative for dryness.  Respiratory:  Negative for shortness of breath.   Cardiovascular:  Negative for chest pain and palpitations.  Gastrointestinal:  Negative for blood in stool, constipation and diarrhea.  Endocrine: Negative for increased urination.  Genitourinary:  Negative for involuntary urination.  Musculoskeletal:  Negative for joint pain, gait problem, joint pain, joint swelling, myalgias, muscle weakness, morning stiffness, muscle tenderness and myalgias.  Skin:  Negative for color change, rash, hair loss and sensitivity to sunlight.  Allergic/Immunologic: Negative for susceptible to infections.  Neurological:  Negative for dizziness and headaches.  Hematological:  Negative for swollen glands.  Psychiatric/Behavioral:  Negative for depressed mood and sleep disturbance. The patient is not  nervous/anxious.     PMFS History:  Patient Active Problem List   Diagnosis Date Noted   Essential hypertension 07/27/2021   Family history of psoriasis 06/14/2020   Psoriatic arthritis ([place]) 05/29/2020   Psoriasis 05/29/2020    Past Medical History:  Diagnosis Date   Psoriatic arthritis ([place])     Family History  Problem Relation Age of Onset   Hypertension Mother    Diabetes Father    Parkinson's disease Father    Healthy Brother    Healthy Brother    Hypertension Brother    Healthy Son    Healthy Son    History reviewed. No pertinent surgical history. Social History   Social History Narrative   Not on file   Immunization History  Administered Date(s) Administered   Moderna Sars-Covid-2 Vaccination 04/21/2019, 05/21/2019, 02/10/2020     Objective: Vital Signs: BP (!) 169/110 (BP Location: Left Arm, Patient Position: Sitting, Cuff Size: Normal)   Pulse 61   Resp 15   Ht 5\' 9"  (1.753 m)   Wt 175 lb 3.2 oz (79.5 kg)   BMI 25.87 kg/m    Physical Exam Vitals and nursing note reviewed.  Constitutional:      Appearance: He is well-developed.  HENT:     Head: Normocephalic and atraumatic.  Eyes:     Conjunctiva/sclera: Conjunctivae normal.     Pupils: Pupils are equal, round, and reactive to light.  Cardiovascular:     Rate and Rhythm: Normal rate and regular rhythm.     Heart sounds: Normal heart sounds.  Pulmonary:     Effort:  Pulmonary effort is normal.     Breath sounds: Normal breath sounds.  Abdominal:     General: Bowel sounds are normal.     Palpations: Abdomen is soft.  Musculoskeletal:     Cervical back: Normal range of motion and neck supple.  Skin:    General: Skin is warm and dry.     Capillary Refill: Capillary refill takes less than 2 seconds.  Neurological:     Mental Status: He is alert and oriented to person, place, and time.  Psychiatric:        Behavior: Behavior normal.      Musculoskeletal Exam: Cervical, thoracic, lumbar  spine were in good range of motion.  He had no SI joint tenderness.  Shoulder joints, elbow joints, wrist joints, MCPs PIPs and DIPs were in range of motion with no synovitis.  Hip joints, knee joints, ankles, MTPs and PIPs with good range of motion with no synovitis.  No planter fasciitis or Achilles tendinitis was noted.  CDAI Exam: CDAI Score: -- Patient Global: --; Provider Global: -- Swollen: --; Tender: -- Joint Exam 01/01/2022   No joint exam has been documented for this visit   There is currently no information documented on the homunculus. Go to the Rheumatology activity and complete the homunculus joint exam.  Investigation: No additional findings.  Imaging: No results found.  Recent Labs: Lab Results  Component Value Date   WBC 6.6 11/22/2021   HGB 15.2 11/22/2021   PLT 311 11/22/2021   NA 140 11/22/2021   K 4.5 11/22/2021   CL 103 11/22/2021   CO2 30 11/22/2021   GLUCOSE 104 (H) 11/22/2021   BUN 13 11/22/2021   CREATININE 0.84 11/22/2021   BILITOT 0.4 11/22/2021   AST 20 11/22/2021   ALT 29 11/22/2021   PROT 7.2 11/22/2021   CALCIUM 9.8 11/22/2021   GFRAA 127 06/27/2020   QFTBGOLDPLUS NEGATIVE 07/27/2021    Speciality Comments: Dxd in [place] 2018, Started MTX 6/wk then decresed to 4/wk. dcd ZOX09/60 restarted Tremfya started 09/06/20-04/15/21, Taltz started 06/14/21  Procedures:  No procedures performed Allergies: Patient has no known allergies.   Assessment / Plan:     Visit Diagnoses: Psoriatic arthritis ([place]) - dxd 2018 in [place].h/o inflammatory arthritis, Achilles tendinitis, plantar fasciitis.  He had an adequate response to methotrexate and Tremfya in the past.  He had been on Taltz since Jun 14, 2021 and has been tolerating it well.  According to the patient he has been taking Taltz 80 mg subcu every 28 days without any interruption.  He denies any history of joint swelling, psoriasis, uveitis, Achilles tendinitis, Planter fasciitis or dactylitis.  No  synovitis or dactylitis was noted on the examination today.  Psoriasis-he had no active psoriasis lesions.  High risk medication use - Taltz 80 mg every 28 days, previously on tremfya.  Labs obtained on November 22, 2021 CBC and CMP were normal.  TB gold was negative on July 27, 2021.  He was advised to get labs in January and every 3 months.  Information regarding immunization was placed in the AVS.  He was advised to hold Taltz if he develops an infection and resume after the infection resolves.  Essential hypertension-his blood pressure was elevated at 147/92.  Repeat blood pressure was higher.  He stated that he forgot to take amlodipine this morning.  He states he has been also eating salty food.  He was advised to monitor blood pressure closely and follow-up with his PCP regarding  the management of hypertension.  Orders: No orders of the defined types were placed in this encounter.  No orders of the defined types were placed in this encounter.    Follow-Up Instructions: Return in about 5 months (around 06/02/2022) for Psoriatic arthritis.   Bo Merino, MD  Note - This record has been created using Editor, commissioning.  Chart creation errors have been sought, but may not always  have been located. Such creation errors do not reflect on  the standard of medical care.

## 2022-01-01 ENCOUNTER — Encounter: Payer: Self-pay | Admitting: Rheumatology

## 2022-01-01 ENCOUNTER — Ambulatory Visit: Payer: BC Managed Care – PPO | Attending: Rheumatology | Admitting: Rheumatology

## 2022-01-01 VITALS — BP 169/110 | HR 61 | Resp 15 | Ht 69.0 in | Wt 175.2 lb

## 2022-01-01 DIAGNOSIS — L4050 Arthropathic psoriasis, unspecified: Secondary | ICD-10-CM | POA: Diagnosis not present

## 2022-01-01 DIAGNOSIS — I10 Essential (primary) hypertension: Secondary | ICD-10-CM | POA: Diagnosis not present

## 2022-01-01 DIAGNOSIS — R600 Localized edema: Secondary | ICD-10-CM

## 2022-01-01 DIAGNOSIS — Z79899 Other long term (current) drug therapy: Secondary | ICD-10-CM | POA: Diagnosis not present

## 2022-01-01 DIAGNOSIS — L409 Psoriasis, unspecified: Secondary | ICD-10-CM | POA: Diagnosis not present

## 2022-01-01 NOTE — Patient Instructions (Signed)
Standing Labs We placed an order today for your standing lab work.   Please have your standing labs drawn in  January and every 3 months  Please have your labs drawn 2 weeks prior to your appointment so that the provider can discuss your lab results at your appointment.  Please note that you may see your imaging and lab results in MyChart before we have reviewed them. We will contact you once all results are reviewed. Please allow our office up to 72 hours to thoroughly review all of the results before contacting the office for clarification of your results.  Lab hours are:   Monday through Thursday from 8:00 am -12:30 pm and 1:00 pm-5:00 pm and Friday from 8:00 am-12:00 pm.  Please be advised, all patients with office appointments requiring lab work will take precedent over walk-in lab work.   Labs are drawn by [facility]. Please bring your co-pay at the time of your lab draw.  You may receive a bill from [facility] for your lab work.  Please note if you are on Hydroxychloroquine and and an order has been placed for a Hydroxychloroquine level, you will need to have it drawn 4 hours or more after your last dose.  If you wish to have your labs drawn at another location, please call the office 24 hours in advance so we can fax the orders.  The office is located at [address], [address], [place], [place] [address] No appointment is necessary.    If you have any questions regarding directions or hours of operation,  please call [phone].   As a reminder, please drink plenty of water prior to coming for your lab work. Thanks!   Vaccines You are taking a medication(s) that can suppress your immune system.  The following immunizations are recommended: Flu annually Covid-19  Td/Tdap (tetanus, diphtheria, pertussis) every 10 years Pneumonia (Prevnar 15 then Pneumovax 23 at least 1 year apart.  Alternatively, can take Prevnar 20 without needing additional dose) Shingrix: 2 doses from 4 weeks  to 6 months apart  Please check with your PCP to make sure you are up to date.   If you have signs or symptoms of an infection or start antibiotics: First, call your PCP for workup of your infection. Hold your medication through the infection, until you complete your antibiotics, and until symptoms resolve if you take the following: Injectable medication (Actemra, Benlysta, Cimzia, Cosentyx, Enbrel, Humira, Kevzara, Orencia, Remicade, Simponi, Stelara, Taltz, Tremfya) Methotrexate Leflunomide (Arava) Mycophenolate (Cellcept) Xeljanz, Olumiant, or Rinvoq  

## 2022-01-10 ENCOUNTER — Other Ambulatory Visit: Payer: Self-pay | Admitting: Physician Assistant

## 2022-02-26 NOTE — Progress Notes (Signed)
Office Visit Note  Patient: Tyler Matthews             Date of Birth: 10-27-1980           MRN: [id number]             PCP: Merrilee Seashore, MD Referring: Merrilee Seashore, MD Visit Date: 03/12/2022 Occupation: @GUAROCC @  Subjective:  Pain in left thumb  History of Present Illness: Dusten Ellinwood is a 42 y.o. male tree of psoriatic arthritis and psoriasis.  He states for the last month he has been experiencing pain and discomfort in his left thumb.  He states he was lifting a heavy object hand he started experiencing pain at the time.  He also has a small children and he picks which exacerbates the pain.  He had similar symptoms several years ago in his right hand.  He has been using Voltaren gel and also using a brace without much help.  He has been on Taltz 80 mg subcu every 28 days without any interruption.  There is no history of joint swelling or joint pain.  There is no history of psoriasis flare.  There is no history of Planter fasciitis or Achilles tendinitis.  No history of uveitis.    Activities of Daily Living:  Patient reports morning stiffness for 0 minute.   Patient Denies nocturnal pain.  Difficulty dressing/grooming: Denies Difficulty climbing stairs: Denies Difficulty getting out of chair: Denies Difficulty using hands for taps, buttons, cutlery, and/or writing: Denies  Review of Systems  Constitutional:  Negative for fatigue.  HENT:  Negative for mouth sores and mouth dryness.   Eyes:  Negative for dryness.  Respiratory:  Negative for difficulty breathing.   Cardiovascular:  Positive for hypertension. Negative for palpitations.  Gastrointestinal:  Negative for constipation and diarrhea.  Endocrine: Negative for increased urination.  Genitourinary:  Negative for difficulty urinating.  Musculoskeletal:  Negative for joint pain, joint pain, myalgias, muscle tenderness and myalgias.  Skin:  Negative for color change, rash and sensitivity to sunlight.   Allergic/Immunologic: Negative for susceptible to infections.  Neurological:  Negative for dizziness and headaches.  Hematological:  Negative for swollen glands.  Psychiatric/Behavioral:  Negative for depressed mood and sleep disturbance. The patient is not nervous/anxious.     PMFS History:  Patient Active Problem List   Diagnosis Date Noted   Essential hypertension 07/27/2021   Family history of psoriasis 06/14/2020   Psoriatic arthritis ([place]) 05/29/2020   Psoriasis 05/29/2020    Past Medical History:  Diagnosis Date   Psoriatic arthritis ([place])     Family History  Problem Relation Age of Onset   Hypertension Mother    Diabetes Father    Parkinson's disease Father    Healthy Brother    Healthy Brother    Hypertension Brother    Healthy Son    Healthy Son    History reviewed. No pertinent surgical history. Social History   Social History Narrative   Not on file   Immunization History  Administered Date(s) Administered   Moderna Sars-Covid-2 Vaccination 04/21/2019, 05/21/2019, 02/10/2020     Objective: Vital Signs: BP (!) 156/104 (BP Location: Left Arm, Patient Position: Sitting, Cuff Size: Normal)   Pulse 74   Resp 14   Ht 5\' 10"  (1.778 m)   Wt 178 lb 3.2 oz (80.8 kg)   BMI 25.57 kg/m    Physical Exam Vitals and nursing note reviewed.  Constitutional:      Appearance: He is well-developed.  HENT:  Head: Normocephalic and atraumatic.  Eyes:     Conjunctiva/sclera: Conjunctivae normal.     Pupils: Pupils are equal, round, and reactive to light.  Cardiovascular:     Rate and Rhythm: Normal rate and regular rhythm.     Heart sounds: Normal heart sounds.  Pulmonary:     Effort: Pulmonary effort is normal.     Breath sounds: Normal breath sounds.  Abdominal:     General: Bowel sounds are normal.     Palpations: Abdomen is soft.  Musculoskeletal:     Cervical back: Normal range of motion and neck supple.  Skin:    General: Skin is warm and dry.      Capillary Refill: Capillary refill takes less than 2 seconds.  Neurological:     Mental Status: He is alert and oriented to person, place, and time.  Psychiatric:        Behavior: Behavior normal.      Musculoskeletal Exam: Cervical spine, thoracic and lumbar spine were in good range of motion.  Shoulder joints, elbow joints, wrist joints, MCPs PIPs and DIPs with good range of motion with no synovitis.  He had tenderness on extension of his left thumb.  Hip joints, knee joints, ankles, MTPs were in good range of motion with no synovitis.  There was no plantar fasciitis or Achilles tendinitis.  CDAI Exam: CDAI Score: -- Patient Global: --; Provider Global: -- Swollen: --; Tender: -- Joint Exam 03/12/2022   No joint exam has been documented for this visit   There is currently no information documented on the homunculus. Go to the Rheumatology activity and complete the homunculus joint exam.  Investigation: No additional findings.  Imaging: US Guided Needle Placement  Result Date: 03/12/2022 Ultrasound guided injection is preferred based studies that show increased duration, increased effect, greater accuracy, decreased procedural pain, increased response rate, and decreased cost with ultrasound guided versus blind injection.   Verbal informed consent obtained.  Time-out conducted.  Noted no overlying erythema, induration, or other signs of local infection. Ultrasound-guided left de Quervain's tenosynovitis: After sterile prep with Betadine, injected 0.5 mL 1% lidocaine and 20 mg Kenalog using a 27-gauge needle, and the extensor tendon compartment.     Recent Labs: Lab Results  Component Value Date   WBC 6.6 11/22/2021   HGB 15.2 11/22/2021   PLT 311 11/22/2021   NA 140 11/22/2021   K 4.5 11/22/2021   CL 103 11/22/2021   CO2 30 11/22/2021   GLUCOSE 104 (H) 11/22/2021   BUN 13 11/22/2021   CREATININE 0.84 11/22/2021   BILITOT 0.4 11/22/2021   AST 20 11/22/2021   ALT 29  11/22/2021   PROT 7.2 11/22/2021   CALCIUM 9.8 11/22/2021   GFRAA 127 06/27/2020   QFTBGOLDPLUS NEGATIVE 07/27/2021    Speciality Comments: Dxd in [place] 2018, Started MTX 6/wk then decresed to 4/wk. dcd MTX02/22 restarted Tremfya started 09/06/20-04/15/21, Taltz started 06/14/21  Procedures:  Hand/UE Inj: L extensor compartment 1 for de Quervain's tenosynovitis on 03/12/2022 8:38 AM Indications: pain and tendon swelling Details: 27 G needle, ultrasound-guided Medications: 0.5 mL lidocaine 1 %; 20 mg triamcinolone acetonide 40 MG/ML Aspirate: 0 mL Procedure, treatment alternatives, risks and benefits explained, specific risks discussed. Immediately prior to procedure a time out was called to verify the correct patient, procedure, equipment, support staff and site/side marked as required. Patient was prepped and draped in the usual sterile fashion.     Allergies: Patient has no known allergies.   Assessment /  Plan:     Visit Diagnoses: De Quervain's tenosynovitis, left -patient has been experiencing pain and discomfort in his left hand and consistent with de Quervain's tenosynovitis.  He DIP in full extension.  The symptoms started after lifting an heavy object.  He also lifts up his kids which causes pain.  Patient had similar symptoms in his right thumb few years ago.  He has been using Voltaren gel and using a brace which has not been helping.  He requested a cortisone injection.  After indications side effects contraindications were discussed left extensor tendon compartment was injected as described above.  Patient has a brace he will continue to use.  Plan: US Guided Needle Placement  Psoriatic arthritis ([place]) - dxd 2018 in [place].h/o inflammatory arthritis, Achilles tendinitis, plantar fasciitis.  He had an adequate response to methotrexate and Tremfya in the past.  He has been doing well as regards to psoriatic arthritis.  No synovitis was noted.  He denies plantar fasciitis or Achilles  tendinitis.  Psoriasis-he had no active psoriasis lesions.  High risk medication use - Taltz 80 mg every 28 days, previously on tremfya.  November 22, 2021 CBC and CMP were normal.  TB Gold was negative on July 27, 2021.  Essential hypertension-blood pressure was 156/104.  Repeat blood pressure was also elevated.  He was advised to monitor blood pressure closely and follow-up with his PCP.  Orders: Orders Placed This Encounter  Procedures   Hand/UE Inj   US Guided Needle Placement   No orders of the defined types were placed in this encounter.   Follow-Up Instructions: Return for Psoriatic arthritis.   Bo Merino, MD  Note - This record has been created using Editor, commissioning.  Chart creation errors have been sought, but may not always  have been located. Such creation errors do not reflect on  the standard of medical care.

## 2022-03-12 ENCOUNTER — Ambulatory Visit (INDEPENDENT_AMBULATORY_CARE_PROVIDER_SITE_OTHER): Payer: BC Managed Care – PPO

## 2022-03-12 ENCOUNTER — Ambulatory Visit: Payer: BC Managed Care – PPO | Attending: Rheumatology | Admitting: Rheumatology

## 2022-03-12 ENCOUNTER — Encounter: Payer: Self-pay | Admitting: Rheumatology

## 2022-03-12 VITALS — BP 156/104 | HR 74 | Resp 14 | Ht 70.0 in | Wt 178.2 lb

## 2022-03-12 DIAGNOSIS — L409 Psoriasis, unspecified: Secondary | ICD-10-CM | POA: Diagnosis not present

## 2022-03-12 DIAGNOSIS — L4050 Arthropathic psoriasis, unspecified: Secondary | ICD-10-CM | POA: Diagnosis not present

## 2022-03-12 DIAGNOSIS — M654 Radial styloid tenosynovitis [de Quervain]: Secondary | ICD-10-CM

## 2022-03-12 DIAGNOSIS — Z79899 Other long term (current) drug therapy: Secondary | ICD-10-CM

## 2022-03-12 DIAGNOSIS — I10 Essential (primary) hypertension: Secondary | ICD-10-CM

## 2022-03-12 MED ORDER — LIDOCAINE HCL 1 % IJ SOLN
0.5000 mL | INTRAMUSCULAR | Status: AC | PRN
  Administered 2022-03-12: .5 mL

## 2022-03-12 MED ORDER — TRIAMCINOLONE ACETONIDE 40 MG/ML IJ SUSP
20.0000 mg | INTRAMUSCULAR | Status: AC | PRN
  Administered 2022-03-12: 20 mg

## 2022-03-12 NOTE — Patient Instructions (Signed)
De Quervain's Tenosynovitis  De Quervain's tenosynovitis is a condition that causes inflammation of the tendon on the thumb side of the wrist. Tendons are cords of tissue that connect bones to muscles. The tendons in the hand pass through a tunnel called a sheath. A slippery layer of tissue (synovium) lets the tendons move smoothly in the sheath. With de Quervain's tenosynovitis, the sheath swells or thickens, causing friction and pain. The condition is also called de Quervain's disease and de Quervain's syndrome. It occurs most often in women who are 58-35 years old. What are the causes? The exact cause of this condition is not known. It may be associated with overuse of the hand and wrist. What increases the risk? You are more likely to develop this condition if you: Use your hands far more than normal, especially if you repeat certain movements that involve twisting your hand or using a tight grip. Are pregnant. Are a middle-aged woman. Have rheumatoid arthritis. Have diabetes. What are the signs or symptoms? The main symptom of this condition is pain on the thumb side of the wrist. The pain may get worse when you grasp something or turn your wrist. Other symptoms may include: Pain that extends up the forearm. Swelling of your wrist and hand. Trouble moving the thumb and wrist. A sensation of snapping in the wrist. A bump filled with fluid (cyst) in the area of the pain. How is this diagnosed? This condition may be diagnosed based on: Your symptoms and medical history. A physical exam. During the exam, your health care provider may do a simple test Wynn Maudlin test) that involves pulling your thumb and wrist to see if this causes pain. You may also need to have an X-ray or ultrasound. How is this treated? Treatment for this condition may include: Avoiding any activity that causes pain and swelling. Taking medicines. Anti-inflammatory medicines and corticosteroid injections may be used  to reduce inflammation and relieve pain. Wearing a splint. Having surgery. This may be needed if other treatments do not work. Once the pain and swelling have gone down, you may start: Physical therapy. This includes exercises to improve movement and strength in your wrist and thumb. Occupational therapy. This includes adjusting how you move your wrist. Follow these instructions at home: If you have a splint: Wear the splint as told by your health care provider. Remove it only as told by your health care provider. Loosen the splint if your fingers tingle, become numb, or turn cold and blue. Keep the splint clean. If the splint is not waterproof: Do not let it get wet. Cover it with a watertight covering when you take a bath or a shower. Managing pain, stiffness, and swelling  Avoid movements and activities that cause pain and swelling in the wrist area. If directed, put ice on the painful area. This may be helpful after doing activities that involve the sore wrist. To do this: Put ice in a plastic bag. Place a towel between your skin and the bag. Leave the ice on for 20 minutes, 2-3 times a day. Remove the ice if your skin turns bright red. This is very important. If you cannot feel pain, heat, or cold, you have a greater risk of damage to the area. Move your fingers often to reduce stiffness and swelling. Raise (elevate) the injured area above the level of your heart while you are sitting or lying down. General instructions Return to your normal activities as told by your health care provider. Ask your  health care provider what activities are safe for you. Take over-the-counter and prescription medicines only as told by your health care provider. Keep all follow-up visits. This is important. Contact a health care provider if: Your pain medicine does not help. Your pain gets worse. You develop new symptoms. Summary De Quervain's tenosynovitis is a condition that causes inflammation  of the tendon on the thumb side of the wrist. The condition occurs most often in women who are 30-50 years old. The exact cause of this condition is not known. It may be associated with overuse of the hand and wrist. Treatment starts with avoiding activity that causes pain or swelling in the wrist area. Other treatments may include wearing a splint and taking medicine. Sometimes, surgery is needed. This information is not intended to replace advice given to you by your health care provider. Make sure you discuss any questions you have with your health care provider. Document Revised: 05/12/2019 Document Reviewed: 05/12/2019 Elsevier Patient Education  2023 [employer] Inc[employer]  

## 2022-03-18 ENCOUNTER — Other Ambulatory Visit: Payer: Self-pay | Admitting: Physician Assistant

## 2022-03-18 DIAGNOSIS — Z79899 Other long term (current) drug therapy: Secondary | ICD-10-CM

## 2022-03-18 DIAGNOSIS — L4050 Arthropathic psoriasis, unspecified: Secondary | ICD-10-CM

## 2022-03-18 DIAGNOSIS — L409 Psoriasis, unspecified: Secondary | ICD-10-CM

## 2022-03-18 NOTE — Telephone Encounter (Signed)
Next Visit: 06/07/2022  Last Visit: 03/12/2022  Last Fill: 12/06/2021  JP:VGKKDPTEL arthritis   Current Dose per office note on 03/12/2022: Taltz 80 mg every 28 days   Labs: 11/22/2021 CBC and CMP WNL   TB Gold: 07/27/2021 negative    Attempted to contact patient and left message to advise patient he is due to update labs.   Okay to refill 30 day supply of taltz?

## 2022-03-22 ENCOUNTER — Other Ambulatory Visit: Payer: Self-pay | Admitting: Physician Assistant

## 2022-03-22 DIAGNOSIS — Z79899 Other long term (current) drug therapy: Secondary | ICD-10-CM

## 2022-03-22 DIAGNOSIS — L409 Psoriasis, unspecified: Secondary | ICD-10-CM

## 2022-03-22 DIAGNOSIS — L4050 Arthropathic psoriasis, unspecified: Secondary | ICD-10-CM

## 2022-03-26 ENCOUNTER — Other Ambulatory Visit: Payer: Self-pay | Admitting: *Deleted

## 2022-03-26 DIAGNOSIS — Z79899 Other long term (current) drug therapy: Secondary | ICD-10-CM

## 2022-03-27 LAB — CBC WITH DIFFERENTIAL/PLATELET
Absolute Monocytes: 570 cells/uL (ref 200–950)
Basophils Absolute: 99 cells/uL (ref 0–200)
Basophils Relative: 1.3 %
Eosinophils Absolute: 350 cells/uL (ref 15–500)
Eosinophils Relative: 4.6 %
HCT: 46.2 % (ref 38.5–50.0)
Hemoglobin: 15.7 g/dL (ref 13.2–17.1)
Lymphs Abs: 2341 cells/uL (ref 850–3900)
MCH: 29.5 pg (ref 27.0–33.0)
MCHC: 34 g/dL (ref 32.0–36.0)
MCV: 86.8 fL (ref 80.0–100.0)
MPV: 9.9 fL (ref 7.5–12.5)
Monocytes Relative: 7.5 %
Neutro Abs: 4241 cells/uL (ref 1500–7800)
Neutrophils Relative %: 55.8 %
Platelets: 359 10*3/uL (ref 140–400)
RBC: 5.32 10*6/uL (ref 4.20–5.80)
RDW: 13.1 % (ref 11.0–15.0)
Total Lymphocyte: 30.8 %
WBC: 7.6 10*3/uL (ref 3.8–10.8)

## 2022-03-27 LAB — COMPLETE METABOLIC PANEL WITH GFR
AG Ratio: 1.4 (calc) (ref 1.0–2.5)
ALT: 25 U/L (ref 9–46)
AST: 17 U/L (ref 10–40)
Albumin: 4.3 g/dL (ref 3.6–5.1)
Alkaline phosphatase (APISO): 79 U/L (ref 36–130)
BUN: 19 mg/dL (ref 7–25)
CO2: 29 mmol/L (ref 20–32)
Calcium: 10.1 mg/dL (ref 8.6–10.3)
Chloride: 104 mmol/L (ref 98–110)
Creat: 0.83 mg/dL (ref 0.60–1.29)
Globulin: 3.1 g/dL (calc) (ref 1.9–3.7)
Glucose, Bld: 87 mg/dL (ref 65–99)
Potassium: 4.6 mmol/L (ref 3.5–5.3)
Sodium: 141 mmol/L (ref 135–146)
Total Bilirubin: 0.5 mg/dL (ref 0.2–1.2)
Total Protein: 7.4 g/dL (ref 6.1–8.1)
eGFR: 113 mL/min/{1.73_m2} (ref >=60)

## 2022-03-27 NOTE — Progress Notes (Signed)
CBC and CMP WNL

## 2022-04-09 ENCOUNTER — Other Ambulatory Visit: Payer: Self-pay | Admitting: Rheumatology

## 2022-04-09 DIAGNOSIS — Z79899 Other long term (current) drug therapy: Secondary | ICD-10-CM

## 2022-04-09 DIAGNOSIS — L409 Psoriasis, unspecified: Secondary | ICD-10-CM

## 2022-04-09 DIAGNOSIS — L4050 Arthropathic psoriasis, unspecified: Secondary | ICD-10-CM

## 2022-04-09 NOTE — Telephone Encounter (Signed)
Next Visit: 06/07/2022  Last Visit: 03/12/2022  Last Fill: 03/18/2022  DX:De Quervain's tenosynovitis, left    Current Dose per office note 03/12/2022: altz 80 mg every 28 days   Labs: 03/26/2022 CBC and CMP WNL   TB Gold: 07/27/2021 Negative   Okay to refill Taltz?

## 2022-04-13 ENCOUNTER — Other Ambulatory Visit: Payer: Self-pay | Admitting: Rheumatology

## 2022-04-13 DIAGNOSIS — L4050 Arthropathic psoriasis, unspecified: Secondary | ICD-10-CM

## 2022-04-13 DIAGNOSIS — L409 Psoriasis, unspecified: Secondary | ICD-10-CM

## 2022-04-13 DIAGNOSIS — Z79899 Other long term (current) drug therapy: Secondary | ICD-10-CM

## 2022-05-24 NOTE — Progress Notes (Deleted)
Office Visit Note  Patient: Tyler Matthews             Date of Birth: 1980-09-02           MRN: [id number]             PCP: Georgianne Fick, MD Referring: Georgianne Fick, MD Visit Date: 06/07/2022 Occupation: @GUAROCC @  Subjective:  No chief complaint on file.   History of Present Illness: Tyler Matthews is a 42 y.o. male ***     Activities of Daily Living:  Patient reports morning stiffness for *** {minute/hour:19697}.   Patient {ACTIONS;DENIES/REPORTS:21021675::"Denies"} nocturnal pain.  Difficulty dressing/grooming: {ACTIONS;DENIES/REPORTS:21021675::"Denies"} Difficulty climbing stairs: {ACTIONS;DENIES/REPORTS:21021675::"Denies"} Difficulty getting out of chair: {ACTIONS;DENIES/REPORTS:21021675::"Denies"} Difficulty using hands for taps, buttons, cutlery, and/or writing: {ACTIONS;DENIES/REPORTS:21021675::"Denies"}  No Rheumatology ROS completed.   PMFS History:  Patient Active Problem List   Diagnosis Date Noted   Essential hypertension 07/27/2021   Family history of psoriasis 06/14/2020   Psoriatic arthritis 05/29/2020   Psoriasis 05/29/2020    Past Medical History:  Diagnosis Date   Psoriatic arthritis ([facility])     Family History  Problem Relation Age of Onset   Hypertension Mother    Diabetes Father    Parkinson's disease Father    Healthy Brother    Healthy Brother    Hypertension Brother    Healthy Son    Healthy Son    No past surgical history on file. Social History   Social History Narrative   Not on file   Immunization History  Administered Date(s) Administered   Moderna Sars-Covid-2 Vaccination 04/21/2019, 05/21/2019, 02/10/2020     Objective: Vital Signs: There were no vitals taken for this visit.   Physical Exam   Musculoskeletal Exam: ***  CDAI Exam: CDAI Score: -- Patient Global: --; Provider Global: -- Swollen: --; Tender: -- Joint Exam 06/07/2022   No joint exam has been documented for this visit   There is currently  no information documented on the homunculus. Go to the Rheumatology activity and complete the homunculus joint exam.  Investigation: No additional findings.  Imaging: No results found.  Recent Labs: Lab Results  Component Value Date   WBC 7.6 03/26/2022   HGB 15.7 03/26/2022   PLT 359 03/26/2022   NA 141 03/26/2022   K 4.6 03/26/2022   CL 104 03/26/2022   CO2 29 03/26/2022   GLUCOSE 87 03/26/2022   BUN 19 03/26/2022   CREATININE 0.83 03/26/2022   BILITOT 0.5 03/26/2022   AST 17 03/26/2022   ALT 25 03/26/2022   PROT 7.4 03/26/2022   CALCIUM 10.1 03/26/2022   GFRAA 127 06/27/2020   QFTBGOLDPLUS NEGATIVE 07/27/2021    Speciality Comments: Dxd in [place] 2018, Started MTX 6/wk then decresed to 4/wk. dcd MTX02/22 restarted Tremfya started 09/06/20-04/15/21, Taltz started 06/14/21  Procedures:  No procedures performed Allergies: Patient has no known allergies.   Assessment / Plan:     Visit Diagnoses: Psoriatic arthritis  Psoriasis  High risk medication use  De Quervain's tenosynovitis, left  Achilles tendinitis, right leg  Essential hypertension  Pedal edema  Family history of psoriasis  Orders: No orders of the defined types were placed in this encounter.  No orders of the defined types were placed in this encounter.   Face-to-face time spent with patient was *** minutes. Greater than 50% of time was spent in counseling and coordination of care.  Follow-Up Instructions: No follow-ups on file.   Gearldine Bienenstock, PA-C  Note - This record has been created  using Editor, commissioning.  Chart creation errors have been sought, but may not always  have been located. Such creation errors do not reflect on  the standard of medical care.

## 2022-06-04 NOTE — Progress Notes (Unsigned)
Office Visit Note  Patient: Tyler Matthews             Date of Birth: Jun 18, 1980           MRN: [id number]             PCP: Georgianne Fick, MD Referring: Georgianne Fick, MD Visit Date: 06/05/2022 Occupation: @  Subjective:  No chief complaint on file.   History of Present Illness: Tyler Matthews is a 42 y.o. male ***     Activities of Daily Living:  Patient reports morning stiffness for *** {minute/hour:19697}.   Patient {ACTIONS;DENIES/REPORTS:21021675::"Denies"} nocturnal pain.  Difficulty dressing/grooming: {ACTIONS;DENIES/REPORTS:21021675::"Denies"} Difficulty climbing stairs: {ACTIONS;DENIES/REPORTS:21021675::"Denies"} Difficulty getting out of chair: {ACTIONS;DENIES/REPORTS:21021675::"Denies"} Difficulty using hands for taps, buttons, cutlery, and/or writing: {ACTIONS;DENIES/REPORTS:21021675::"Denies"}  No Rheumatology ROS completed.   PMFS History:  Patient Active Problem List   Diagnosis Date Noted   Essential hypertension 07/27/2021   Family history of psoriasis 06/14/2020   Psoriatic arthritis 05/29/2020   Psoriasis 05/29/2020    Past Medical History:  Diagnosis Date   Psoriatic arthritis ([facility])     Family History  Problem Relation Age of Onset   Hypertension Mother    Diabetes Father    Parkinson's disease Father    Healthy Brother    Healthy Brother    Hypertension Brother    Healthy Son    Healthy Son    No past surgical history on file. Social History   Social History Narrative   Not on file   Immunization History  Administered Date(s) Administered   Moderna Sars-Covid-2 Vaccination 04/21/2019, 05/21/2019, 02/10/2020     Objective: Vital Signs: There were no vitals taken for this visit.   Physical Exam   Musculoskeletal Exam: ***  CDAI Exam: CDAI Score: -- Patient Global: --; Provider Global: -- Swollen: --; Tender: -- Joint Exam 06/05/2022   No joint exam has been documented for this visit   There is currently  no information documented on the homunculus. Go to the Rheumatology activity and complete the homunculus joint exam.  Investigation: No additional findings.  Imaging: No results found.  Recent Labs: Lab Results  Component Value Date   WBC 7.6 03/26/2022   HGB 15.7 03/26/2022   PLT 359 03/26/2022   NA 141 03/26/2022   K 4.6 03/26/2022   CL 104 03/26/2022   CO2 29 03/26/2022   GLUCOSE 87 03/26/2022   BUN 19 03/26/2022   CREATININE 0.83 03/26/2022   BILITOT 0.5 03/26/2022   AST 17 03/26/2022   ALT 25 03/26/2022   PROT 7.4 03/26/2022   CALCIUM 10.1 03/26/2022   GFRAA 127 06/27/2020   QFTBGOLDPLUS NEGATIVE 07/27/2021    Speciality Comments: Dxd in [place] 2018, Started MTX 6/wk then decresed to 4/wk. dcd MTX02/22 restarted Tremfya started 09/06/20-04/15/21, Taltz started 06/14/21  Procedures:  No procedures performed Allergies: Patient has no known allergies.   Assessment / Plan:     Visit Diagnoses: No diagnosis found.  Orders: No orders of the defined types were placed in this encounter.  No orders of the defined types were placed in this encounter.   Face-to-face time spent with patient was *** minutes. Greater than 50% of time was spent in counseling and coordination of care.  Follow-Up Instructions: No follow-ups on file.   Ellen Henri, CMA  Note - This record has been created using Animal nutritionist.  Chart creation errors have been sought, but may not always  have been located. Such creation errors do not reflect on  the  standard of medical care.

## 2022-06-05 ENCOUNTER — Ambulatory Visit: Payer: BC Managed Care – PPO | Attending: Rheumatology | Admitting: Rheumatology

## 2022-06-05 ENCOUNTER — Encounter: Payer: Self-pay | Admitting: Rheumatology

## 2022-06-05 VITALS — BP 160/112 | HR 66 | Resp 16 | Ht 69.0 in | Wt 180.2 lb

## 2022-06-05 DIAGNOSIS — L409 Psoriasis, unspecified: Secondary | ICD-10-CM | POA: Diagnosis not present

## 2022-06-05 DIAGNOSIS — M654 Radial styloid tenosynovitis [de Quervain]: Secondary | ICD-10-CM

## 2022-06-05 DIAGNOSIS — Z79899 Other long term (current) drug therapy: Secondary | ICD-10-CM

## 2022-06-05 DIAGNOSIS — I10 Essential (primary) hypertension: Secondary | ICD-10-CM

## 2022-06-05 DIAGNOSIS — L4050 Arthropathic psoriasis, unspecified: Secondary | ICD-10-CM

## 2022-06-05 NOTE — Patient Instructions (Addendum)
Standing Labs We placed an order today for your standing lab work.   Please have your standing labs drawn in May and every 3 months  Please have your labs drawn 2 weeks prior to your appointment so that the provider can discuss your lab results at your appointment, if possible.  Please note that you may see your imaging and lab results in MyChart before we have reviewed them. We will contact you once all results are reviewed. Please allow our office up to 72 hours to thoroughly review all of the results before contacting the office for clarification of your results.  WALK-IN LAB HOURS  Monday through Thursday from 8:00 am -12:30 pm and 1:00 pm-5:00 pm and Friday from 8:00 am-12:00 pm.  Patients with office visits requiring labs will be seen before walk-in labs.  You may encounter longer than normal wait times. Please allow additional time. Wait times may be shorter on  Monday and Thursday afternoons.  We do not book appointments for walk-in labs. We appreciate your patience and understanding with our staff.   Labs are drawn by [facility]. Please bring your co-pay at the time of your lab draw.  You may receive a bill from [facility] for your lab work.  Please note if you are on Hydroxychloroquine and and an order has been placed for a Hydroxychloroquine level,  you will need to have it drawn 4 hours or more after your last dose.  If you wish to have your labs drawn at another location, please call the office 24 hours in advance so we can fax the orders.  The office is located at [address], [place], [place] [address]   If you have any questions regarding directions or hours of operation,  please call [phone].   As a reminder, please drink plenty of water prior to coming for your lab work. Thanks!   Vaccines You are taking a medication(s) that can suppress your immune system.  The following immunizations are recommended: Flu annually Covid-19  Td/Tdap (tetanus, diphtheria,  pertussis) every 10 years Pneumonia (Prevnar 15 then Pneumovax 23 at least 1 year apart.  Alternatively, can take Prevnar 20 without needing additional dose) Shingrix: 2 doses from 4 weeks to 6 months apart  Please check with your PCP to make sure you are up to date.   If you have signs or symptoms of an infection or start antibiotics: First, call your PCP for workup of your infection. Hold your medication through the infection, until you complete your antibiotics, and until symptoms resolve if you take the following: Injectable medication (Actemra, Benlysta, Cimzia, Cosentyx, Enbrel, Humira, Kevzara, Orencia, Remicade, Simponi, Stelara, Taltz, Tremfya) Methotrexate Leflunomide (Arava) Mycophenolate (Cellcept) Harriette Ohara, Olumiant, or Rinvoq Heart Disease Prevention     Your inflammatory disease increases your risk of heart disease which includes heart attack, stroke, atrial fibrillation (irregular heartbeats), high blood pressure, heart failure and atherosclerosis (plaque in the arteries).  It is important to reduce your risk by:   Keep blood pressure, cholesterol, and blood sugar at healthy levels   Smoking Cessation   Maintain a healthy weight  BMI 20-25   Eat a healthy diet  Plenty of fresh fruit, vegetables, and whole grains  Limit saturated fats, foods high in sodium, and added sugars  DASH and Mediterranean diet   Increase physical activity  Recommend moderate physically activity for 150 minutes per week/ 30 minutes a day for five days a week These can be broken up into three separate ten-minute  sessions during the day.   Reduce Stress  Meditation, slow breathing exercises, yoga, coloring books  Dental visits twice a year

## 2022-06-07 ENCOUNTER — Ambulatory Visit: Payer: BC Managed Care – PPO | Admitting: Rheumatology

## 2022-06-07 DIAGNOSIS — R600 Localized edema: Secondary | ICD-10-CM

## 2022-06-07 DIAGNOSIS — Z840 Family history of diseases of the skin and subcutaneous tissue: Secondary | ICD-10-CM

## 2022-06-07 DIAGNOSIS — Z79899 Other long term (current) drug therapy: Secondary | ICD-10-CM

## 2022-06-07 DIAGNOSIS — L4050 Arthropathic psoriasis, unspecified: Secondary | ICD-10-CM

## 2022-06-07 DIAGNOSIS — M7661 Achilles tendinitis, right leg: Secondary | ICD-10-CM

## 2022-06-07 DIAGNOSIS — I10 Essential (primary) hypertension: Secondary | ICD-10-CM

## 2022-06-07 DIAGNOSIS — M654 Radial styloid tenosynovitis [de Quervain]: Secondary | ICD-10-CM

## 2022-06-07 DIAGNOSIS — L409 Psoriasis, unspecified: Secondary | ICD-10-CM

## 2022-06-18 ENCOUNTER — Other Ambulatory Visit: Payer: Self-pay | Admitting: Rheumatology

## 2022-06-18 ENCOUNTER — Encounter: Payer: Self-pay | Admitting: *Deleted

## 2022-06-18 DIAGNOSIS — Z79899 Other long term (current) drug therapy: Secondary | ICD-10-CM

## 2022-06-18 DIAGNOSIS — L4050 Arthropathic psoriasis, unspecified: Secondary | ICD-10-CM

## 2022-06-18 DIAGNOSIS — L409 Psoriasis, unspecified: Secondary | ICD-10-CM

## 2022-06-18 NOTE — Telephone Encounter (Signed)
Message sent to patient via my chart to advise patient he is due to update labs.

## 2022-06-18 NOTE — Telephone Encounter (Signed)
Last Fill: 04/09/2022   Labs: 03/26/2022 CBC and CMP WNL   TB Gold: 07/27/2021 Neg    Next Visit: 11/27/2022  Last Visit: 06/05/2022  [id number] arthritis   Current Dose per office note 06/05/2022: Taltz 80 mg every 28 days   Okay to refill Taltz?

## 2022-06-22 ENCOUNTER — Other Ambulatory Visit: Payer: Self-pay | Admitting: Rheumatology

## 2022-06-22 DIAGNOSIS — L4050 Arthropathic psoriasis, unspecified: Secondary | ICD-10-CM

## 2022-06-22 DIAGNOSIS — Z79899 Other long term (current) drug therapy: Secondary | ICD-10-CM

## 2022-06-22 DIAGNOSIS — L409 Psoriasis, unspecified: Secondary | ICD-10-CM

## 2022-09-23 IMAGING — CR DG CHEST 2V
2 series · 2 of 2 positions shown · non-contrast
Comparison: None.

CLINICAL DATA: Patient on immunosuppressive therapy.

EXAM:
CHEST - 2 VIEW

[chest pa]
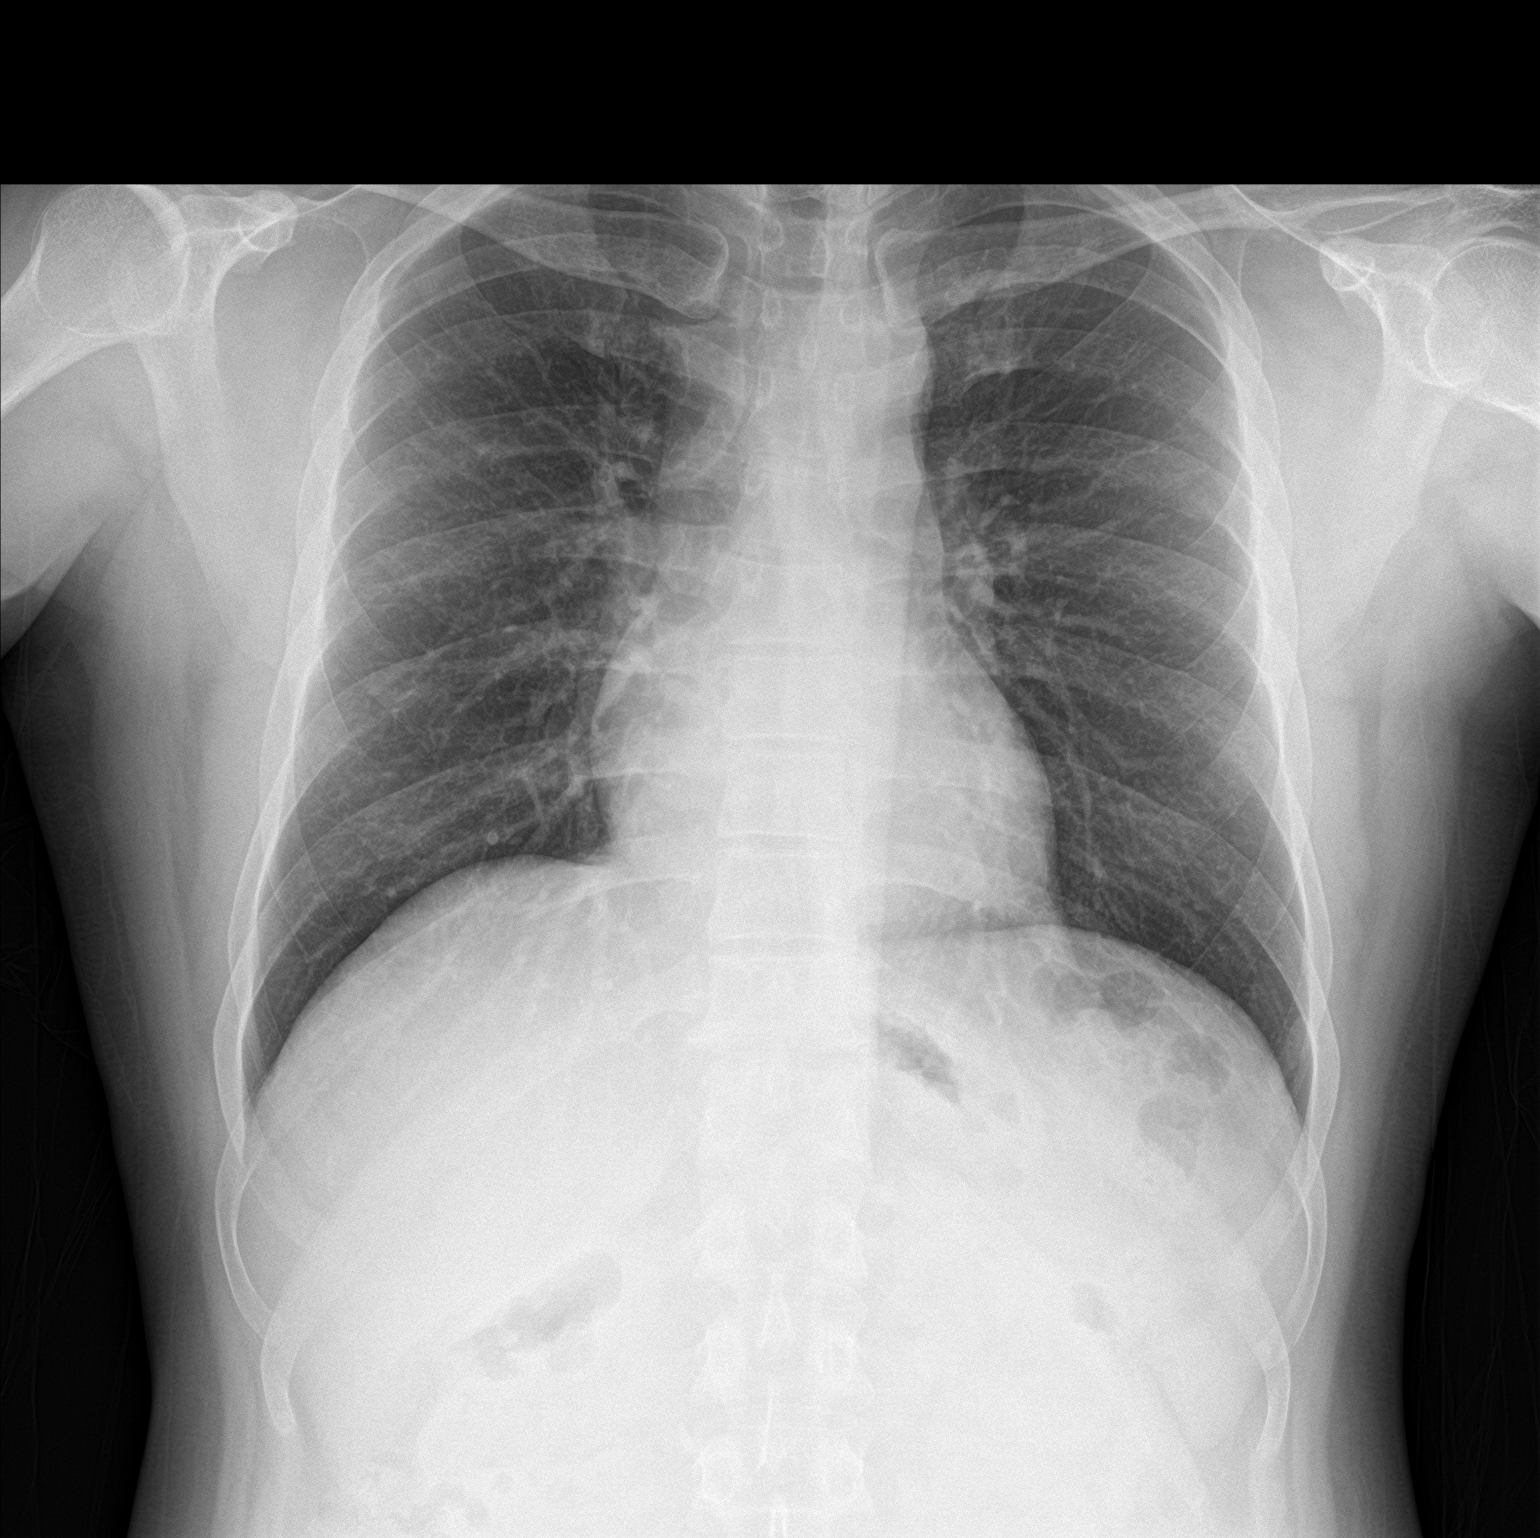

[chest lat]
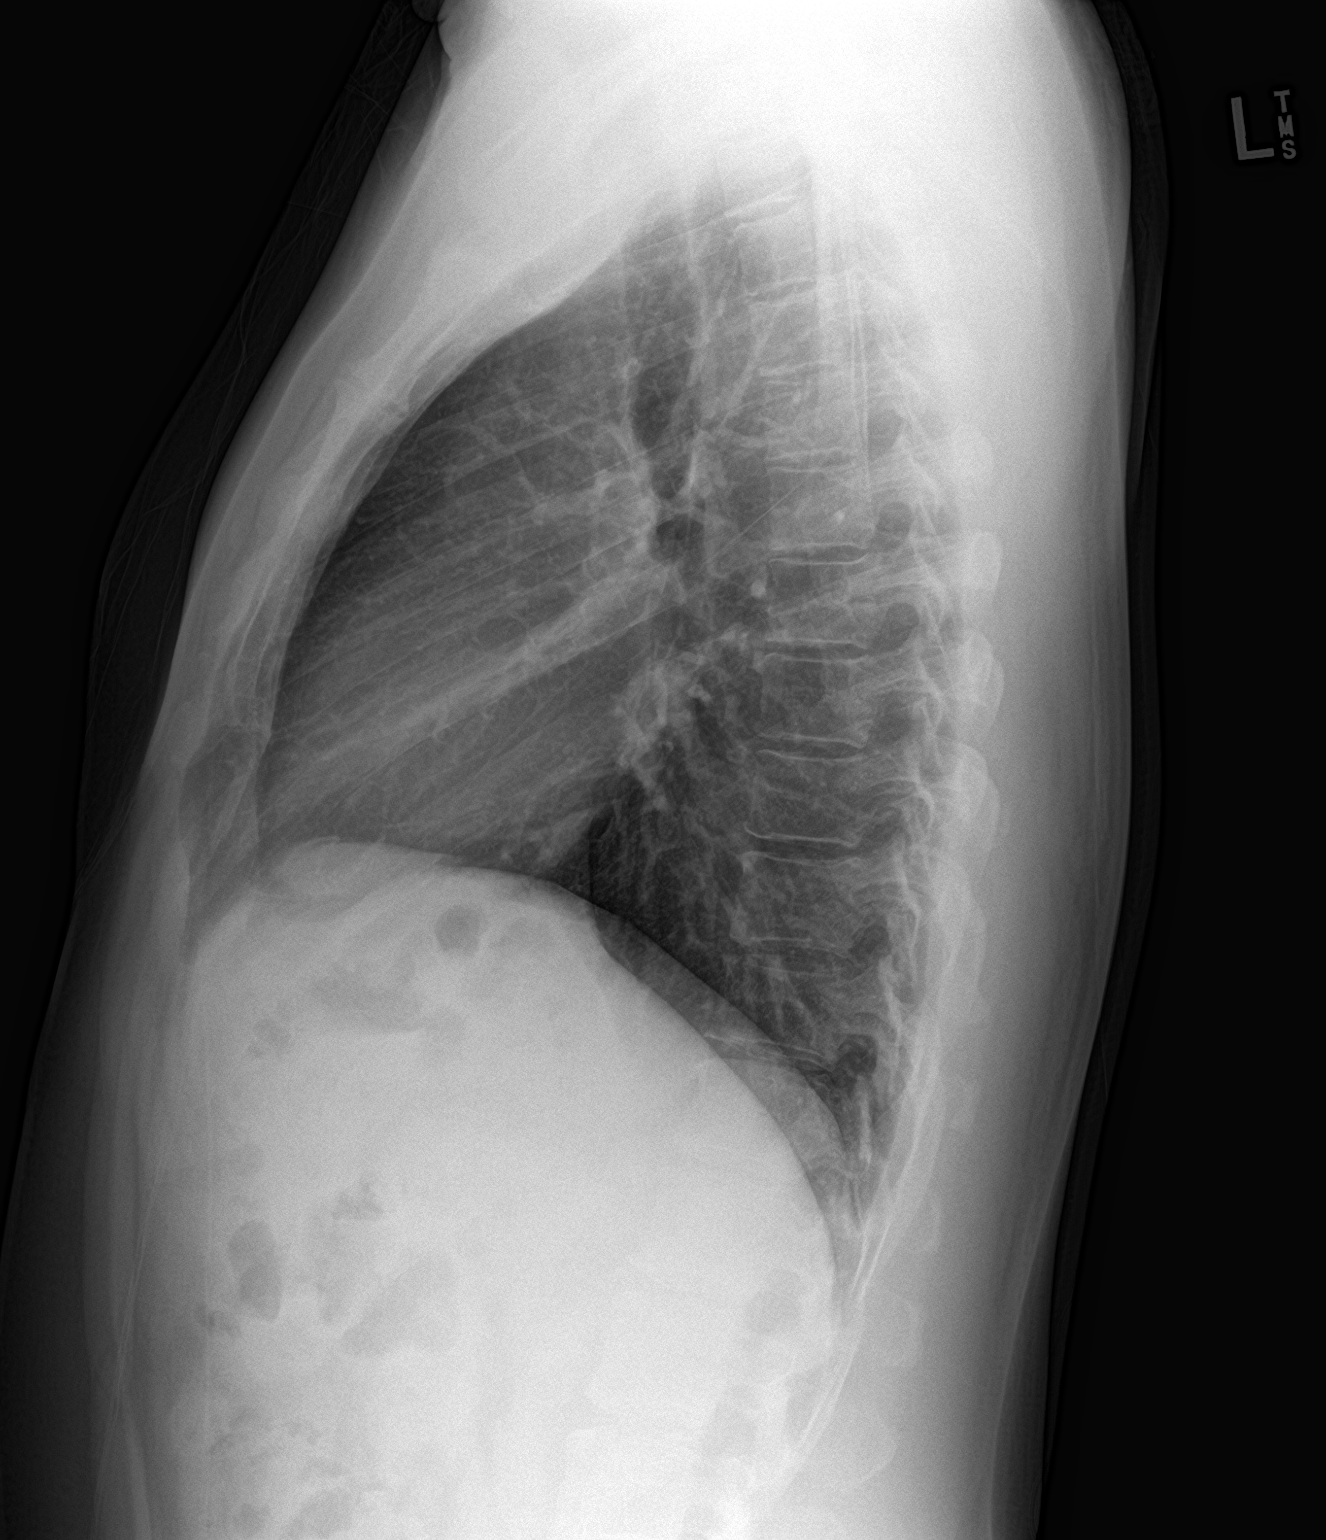

[2 of 2 positions shown; findings below may reference images not displayed]

FINDINGS: The cardiac silhouette, mediastinal and hilar contours are normal.
The lungs are clear. No pleural effusions. The bony thorax is
intact.
IMPRESSION: Normal chest x-ray.

## 2022-10-02 ENCOUNTER — Other Ambulatory Visit: Payer: Self-pay | Admitting: Physician Assistant

## 2022-10-02 DIAGNOSIS — Z79899 Other long term (current) drug therapy: Secondary | ICD-10-CM

## 2022-10-02 DIAGNOSIS — L4050 Arthropathic psoriasis, unspecified: Secondary | ICD-10-CM

## 2022-10-02 DIAGNOSIS — L409 Psoriasis, unspecified: Secondary | ICD-10-CM

## 2022-11-14 NOTE — Progress Notes (Signed)
Office Visit Note  Patient: Tyler Matthews             Date of Birth: 09-22-1980           MRN: [id number]             PCP: Georgianne Fick, MD Referring: Georgianne Fick, MD Visit Date: 11/27/2022 Occupation: @GUAROCC @  Subjective:  Rash on both legs   History of Present Illness: Tyler Matthews is a 42 y.o. male with psoriatic arthritis, and psoriasis.  Patient denies any flares of psoriatic arthritis or psoriasis.  He states he is to get a rash on his scalp but he has not noticed it recently.  He uses topical agents as needed.  He has been taking Taltz 80 mg subcutaneous every 28 days without any interruption.  He denies any side effects from Taltz.  He denies any history of inflammatory arthritis, plantar fasciitis, Achilles tendinitis or uveitis.  According the patient on the combination of amlodipine and Micardis his blood pressure is much better controlled.  He has noticed some rash on his lower extremities and he is concerned that he may have varicose veins.  He states he has compression socks but he has been not wearing them.  He is also concerned about his high cholesterol and intermittent shortness of breath when he runs.  He is trying to schedule an appointment with a cardiologist.    Activities of Daily Living:  Patient reports morning stiffness for denies .   Patient Denies nocturnal pain.  Difficulty dressing/grooming: Denies Difficulty climbing stairs: Denies Difficulty getting out of chair: Denies Difficulty using hands for taps, buttons, cutlery, and/or writing: Denies  Review of Systems  Constitutional:  Negative for fatigue.  HENT:  Negative for mouth sores and mouth dryness.   Eyes:  Negative for dryness.  Respiratory:  Negative for shortness of breath.   Cardiovascular:  Negative for chest pain and palpitations.  Gastrointestinal:  Negative for blood in stool, constipation and diarrhea.  Endocrine: Negative for increased urination.  Genitourinary:   Negative for involuntary urination.  Musculoskeletal:  Negative for joint pain, gait problem, joint pain, joint swelling, myalgias, muscle weakness, morning stiffness, muscle tenderness and myalgias.  Skin:  Negative for color change, hair loss and sensitivity to sunlight.  Allergic/Immunologic: Negative for susceptible to infections.  Neurological:  Negative for dizziness and headaches.  Hematological:  Negative for swollen glands.  Psychiatric/Behavioral:  Negative for depressed mood and sleep disturbance. The patient is not nervous/anxious.     PMFS History:  Patient Active Problem List   Diagnosis Date Noted   Essential hypertension 07/27/2021   Family history of psoriasis 06/14/2020   Psoriatic arthritis (HCC) 05/29/2020   Psoriasis 05/29/2020    Past Medical History:  Diagnosis Date   Psoriatic arthritis (HCC)     Family History  Problem Relation Age of Onset   Hypertension Mother    Diabetes Father    Parkinson's disease Father    Healthy Brother    Healthy Brother    Hypertension Brother    Healthy Son    Healthy Son    History reviewed. No pertinent surgical history. Social History   Social History Narrative   Not on file   Immunization History  Administered Date(s) Administered   Moderna Sars-Covid-2 Vaccination 04/21/2019, 05/21/2019, 02/10/2020     Objective: Vital Signs: BP 135/86 (BP Location: Left Arm, Patient Position: Sitting, Cuff Size: Normal)   Pulse 65   Ht 5' 9.5" (1.765 m)   Wt  176 lb (79.8 kg)   BMI 25.62 kg/m    Physical Exam Vitals and nursing note reviewed.  Constitutional:      Appearance: He is well-developed.  HENT:     Head: Normocephalic and atraumatic.  Eyes:     Conjunctiva/sclera: Conjunctivae normal.     Pupils: Pupils are equal, round, and reactive to light.  Cardiovascular:     Rate and Rhythm: Normal rate and regular rhythm.     Heart sounds: Normal heart sounds.  Pulmonary:     Effort: Pulmonary effort is normal.      Breath sounds: Normal breath sounds.  Abdominal:     General: Bowel sounds are normal.     Palpations: Abdomen is soft.  Musculoskeletal:     Cervical back: Normal range of motion and neck supple.  Skin:    General: Skin is warm and dry.     Capillary Refill: Capillary refill takes less than 2 seconds.  Neurological:     Mental Status: He is alert and oriented to person, place, and time.  Psychiatric:        Behavior: Behavior normal.      Musculoskeletal Exam: Cervical, thoracic and lumbar spine 1 good range of motion.  Shoulder joints, elbow joints, wrist joints, MCPs PIPs and DIPs with good range of motion with no synovitis.  Hip joints, knee joints were in good range of motion without any warmth swelling or effusion.  There was no tenderness over ankles or MTPs.  There was no plantar fasciitis or Achilles tendinitis.  CDAI Exam: CDAI Score: -- Patient Global: --; Provider Global: -- Swollen: --; Tender: -- Joint Exam 11/27/2022   No joint exam has been documented for this visit   There is currently no information documented on the homunculus. Go to the Rheumatology activity and complete the homunculus joint exam.  Investigation: No additional findings.  Imaging: No results found.  Recent Labs: Lab Results  Component Value Date   WBC 7.6 03/26/2022   HGB 15.7 03/26/2022   PLT 359 03/26/2022   NA 141 03/26/2022   K 4.6 03/26/2022   CL 104 03/26/2022   CO2 29 03/26/2022   GLUCOSE 87 03/26/2022   BUN 19 03/26/2022   CREATININE 0.83 03/26/2022   BILITOT 0.5 03/26/2022   AST 17 03/26/2022   ALT 25 03/26/2022   PROT 7.4 03/26/2022   CALCIUM 10.1 03/26/2022   GFRAA 127 06/27/2020   QFTBGOLDPLUS NEGATIVE 07/27/2021   October 29, 2022 CMP glucose 101, creatinine 0.86, GFR 112, AST 27, ALT 44 August 18, 2022 CBC WBC 6.6, hemoglobin 14.5, platelets 306 08/20/2022 triglycerides 183 HDL 31, LDL 119  August 20, 2022 hemoglobin A1c 6.1   Speciality Comments: Dxd in  [place] 2018, Started MTX 6/wk then decresed to 4/wk. dcd MTX02/22 restarted Tremfya started 09/06/20-04/15/21, Taltz started 06/14/21  Procedures:  No procedures performed Allergies: Patient has no known allergies.   Assessment / Plan:     Visit Diagnoses: Psoriatic arthritis (HCC) - dxd 2018 in [place].h/o inflammatory arthritis, Achilles tendinitis, plantar fasciitis.  Patient has been doing well on Taltz 80 mg subcu every 28 days.  He has been on Taltz since May 2023 which she has been tolerating well.  He denies any flares of psoriatic arthritis, Achilles tendinitis, plantar fasciitis, dactylitis or uveitis.  No synovitis was noted on the examination today.  Psoriasis-he gets occasional psoriasis patches on his scalp.  He has not noticed any recent psoriasis patches.  High risk medication use -  Taltz 80 mg every 28 days, previously on tremfya. -He showed me lab results on his cell phone from his PCPs office.  September 2024 CMP was normal.  CBC was normal in July.  Will get CBC and TB Gold today.  Plan: CBC with Differential/Platelet, QuantiFERON-TB Gold Plus.  He was advised to get labs every 3 months.  Annual TB Gold was advised.  Information about immunization was placed in the AVS.  He was advised to hold Taltz if he develops an infection resume after the infection resolves.  De Quervain's tenosynovitis, left-resolved after the injection.  He has some residual hypopigmentation.  Essential hypertension-blood pressure is better controlled at 135/86 today.  Hypertriglyceridemia-his recent labs showed hypertriglyceridemia elevated LDL and low HDL.  Patient is trying to establish with a cardiologist.  Increased risk of heart disease with psoriatic arthritis was discussed.  Dietary modifications were discussed.  Need for regular exercise was emphasized.  Handout Ro placed in the AVS.  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  I  will refer patient to dermatologist per his request.  Prediabetes-hemoglobin A1c was elevated at 6.1.  Orders: Orders Placed This Encounter  Procedures   CBC with Differential/Platelet   QuantiFERON-TB Gold Plus   Ambulatory referral to Dermatology   No orders of the defined types were placed in this encounter.    Follow-Up Instructions: Return in about 5 months (around 04/27/2023) for Psoriatic arthritis.   Pollyann Savoy, MD  Note - This record has been created using Animal nutritionist.  Chart creation errors have been sought, but may not always  have been located. Such creation errors do not reflect on  the standard of medical care.

## 2022-11-27 ENCOUNTER — Ambulatory Visit: Payer: BC Managed Care – PPO | Attending: Rheumatology | Admitting: Rheumatology

## 2022-11-27 ENCOUNTER — Encounter: Payer: Self-pay | Admitting: Rheumatology

## 2022-11-27 VITALS — BP 135/86 | HR 65 | Ht 69.5 in | Wt 176.0 lb

## 2022-11-27 DIAGNOSIS — M654 Radial styloid tenosynovitis [de Quervain]: Secondary | ICD-10-CM

## 2022-11-27 DIAGNOSIS — L4050 Arthropathic psoriasis, unspecified: Secondary | ICD-10-CM | POA: Diagnosis not present

## 2022-11-27 DIAGNOSIS — L409 Psoriasis, unspecified: Secondary | ICD-10-CM | POA: Diagnosis not present

## 2022-11-27 DIAGNOSIS — I10 Essential (primary) hypertension: Secondary | ICD-10-CM

## 2022-11-27 DIAGNOSIS — Z79899 Other long term (current) drug therapy: Secondary | ICD-10-CM

## 2022-11-27 DIAGNOSIS — E781 Pure hyperglyceridemia: Secondary | ICD-10-CM

## 2022-11-27 DIAGNOSIS — L817 Pigmented purpuric dermatosis: Secondary | ICD-10-CM

## 2022-11-27 DIAGNOSIS — R7303 Prediabetes: Secondary | ICD-10-CM

## 2022-11-27 NOTE — Patient Instructions (Signed)
Standing Labs We placed an order today for your standing lab work.   Please have your standing labs drawn in January and every 3 months  Please have your labs drawn 2 weeks prior to your appointment so that the provider can discuss your lab results at your appointment, if possible.  Please note that you may see your imaging and lab results in MyChart before we have reviewed them. We will contact you once all results are reviewed. Please allow our office up to 72 hours to thoroughly review all of the results before contacting the office for clarification of your results.  WALK-IN LAB HOURS  Monday through Thursday from 8:00 am -12:30 pm and 1:00 pm-5:00 pm and Friday from 8:00 am-12:00 pm.  Patients with office visits requiring labs will be seen before walk-in labs.  You may encounter longer than normal wait times. Please allow additional time. Wait times may be shorter on  Monday and Thursday afternoons.  We do not book appointments for walk-in labs. We appreciate your patience and understanding with our staff.   Labs are drawn by [facility]. Please bring your co-pay at the time of your lab draw.  You may receive a bill from [facility] for your lab work.  Please note if you are on Hydroxychloroquine and and an order has been placed for a Hydroxychloroquine level,  you will need to have it drawn 4 hours or more after your last dose.  If you wish to have your labs drawn at another location, please call the office 24 hours in advance so we can fax the orders.  The office is located at [address], [address], [place], [place] [address]   If you have any questions regarding directions or hours of operation,  please call [phone].   As a reminder, please drink plenty of water prior to coming for your lab work. Thanks!   Vaccines You are taking a medication(s) that can suppress your immune system.  The following immunizations are recommended: Flu annually Covid-19  RSV Td/Tdap (tetanus,  diphtheria, pertussis) every 10 years Pneumonia (Prevnar 15 then Pneumovax 23 at least 1 year apart.  Alternatively, can take Prevnar 20 without needing additional dose) Shingrix: 2 doses from 4 weeks to 6 months apart  Please check with your PCP to make sure you are up to date.   If you have signs or symptoms of an infection or start antibiotics: First, call your PCP for workup of your infection. Hold your medication through the infection, until you complete your antibiotics, and until symptoms resolve if you take the following: Injectable medication (Actemra, Benlysta, Cimzia, Cosentyx, Enbrel, Humira, Kevzara, Orencia, Remicade, Simponi, Stelara, Taltz, Tremfya) Methotrexate Leflunomide (Arava) Mycophenolate (Cellcept) Harriette Ohara, Olumiant, or Rinvoq  Heart Disease Prevention   Your inflammatory disease increases your risk of heart disease which includes heart attack, stroke, atrial fibrillation (irregular heartbeats), high blood pressure, heart failure and atherosclerosis (plaque in the arteries).  It is important to reduce your risk by:   Keep blood pressure, cholesterol, and blood sugar at healthy levels   Smoking Cessation   Maintain a healthy weight  BMI 20-25   Eat a healthy diet  Plenty of fresh fruit, vegetables, and whole grains  Limit saturated fats, foods high in sodium, and added sugars  DASH and Mediterranean diet   Increase physical activity  Recommend moderate physically activity for 150 minutes per week/ 30 minutes a day for five days a week These can be broken up into three separate ten-minute  sessions during the day.   Reduce Stress  Meditation, slow breathing exercises, yoga, coloring books  Dental visits twice a year    DASH Eating Plan DASH stands for Dietary Approaches to Stop Hypertension. The DASH eating plan is a healthy eating plan that has been shown to: Lower high blood pressure (hypertension). Reduce your risk for type 2 diabetes, heart disease,  and stroke. Help with weight loss. What are tips for following this plan? Reading food labels Check food labels for the amount of salt (sodium) per serving. Choose foods with less than 5 percent of the Daily Value (DV) of sodium. In general, foods with less than 300 milligrams (mg) of sodium per serving fit into this eating plan. To find whole grains, look for the word "whole" as the first word in the ingredient list. Shopping Buy products labeled as "low-sodium" or "no salt added." Buy fresh foods. Avoid canned foods and pre-made or frozen meals. Cooking Try not to add salt when you cook. Use salt-free seasonings or herbs instead of table salt or sea salt. Check with your health care provider or pharmacist before using salt substitutes. Do not fry foods. Cook foods in healthy ways, such as baking, boiling, grilling, roasting, or broiling. Cook using oils that are good for your heart. These include olive, canola, avocado, soybean, and sunflower oil. Meal planning  Eat a balanced diet. This should include: 4 or more servings of fruits and 4 or more servings of vegetables each day. Try to fill half of your plate with fruits and vegetables. 6-8 servings of whole grains each day. 6 or less servings of lean meat, poultry, or fish each day. 1 oz is 1 serving. A 3 oz (85 g) serving of meat is about the same size as the palm of your hand. One egg is 1 oz (28 g). 2-3 servings of low-fat dairy each day. One serving is 1 cup (237 mL). 1 serving of nuts, seeds, or beans 5 times each week. 2-3 servings of heart-healthy fats. Healthy fats called omega-3 fatty acids are found in foods such as walnuts, flaxseeds, fortified milks, and eggs. These fats are also found in cold-water fish, such as sardines, salmon, and mackerel. Limit how much you eat of: Canned or prepackaged foods. Food that is high in trans fat, such as fried foods. Food that is high in saturated fat, such as fatty meat. Desserts and other  sweets, sugary drinks, and other foods with added sugar. Full-fat dairy products. Do not salt foods before eating. Do not eat more than 4 egg yolks a week. Try to eat at least 2 vegetarian meals a week. Eat more home-cooked food and less restaurant, buffet, and fast food. Lifestyle When eating at a restaurant, ask if your food can be made with less salt or no salt. If you drink alcohol: Limit how much you have to: 0-1 drink a day if you are male. 0-2 drinks a day if you are male. Know how much alcohol is in your drink. In the [place] one drink is one 12 oz bottle of beer (355 mL), one 5 oz glass of wine (148 mL), or one 1 oz glass of hard liquor (44 mL). General information Avoid eating more than 2,300 mg of salt a day. If you have hypertension, you may need to reduce your sodium intake to 1,500 mg a day. Work with your provider to stay at a healthy body weight or lose weight. Ask what the best weight range is for you.  On most days of the week, get at least 30 minutes of exercise that causes your heart to beat faster. This may include walking, swimming, or biking. Work with your provider or dietitian to adjust your eating plan to meet your specific calorie needs. What foods should I eat? Fruits All fresh, dried, or frozen fruit. Canned fruits that are in their natural juice and do not have sugar added to them. Vegetables Fresh or frozen vegetables that are raw, steamed, roasted, or grilled. Low-sodium or reduced-sodium tomato and vegetable juice. Low-sodium or reduced-sodium tomato sauce and tomato paste. Low-sodium or reduced-sodium canned vegetables. Grains Whole-grain or whole-wheat bread. Whole-grain or whole-wheat pasta. Brown rice. Orpah Cobb. Bulgur. Whole-grain and low-sodium cereals. Pita bread. Low-fat, low-sodium crackers. Whole-wheat flour tortillas. Meats and other proteins Skinless chicken or [place]. Ground chicken or [place]. Pork with fat trimmed off. Fish and seafood.  Egg whites. Dried beans, peas, or lentils. Unsalted nuts, nut butters, and seeds. Unsalted canned beans. Lean cuts of beef with fat trimmed off. Low-sodium, lean precooked or cured meat, such as sausages or meat loaves. Dairy Low-fat (1%) or fat-free (skim) milk. Reduced-fat, low-fat, or fat-free cheeses. Nonfat, low-sodium ricotta or cottage cheese. Low-fat or nonfat yogurt. Low-fat, low-sodium cheese. Fats and oils Soft margarine without trans fats. Vegetable oil. Reduced-fat, low-fat, or light mayonnaise and salad dressings (reduced-sodium). Canola, safflower, olive, avocado, soybean, and sunflower oils. Avocado. Seasonings and condiments Herbs. Spices. Seasoning mixes without salt. Other foods Unsalted popcorn and pretzels. Fat-free sweets. The items listed above may not be all the foods and drinks you can have. Talk to a dietitian to learn more. What foods should I avoid? Fruits Canned fruit in a light or heavy syrup. Fried fruit. Fruit in cream or butter sauce. Vegetables Creamed or fried vegetables. Vegetables in a cheese sauce. Regular canned vegetables that are not marked as low-sodium or reduced-sodium. Regular canned tomato sauce and paste that are not marked as low-sodium or reduced-sodium. Regular tomato and vegetable juices that are not marked as low-sodium or reduced-sodium. Rosita Fire. Olives. Grains Baked goods made with fat, such as croissants, muffins, or some breads. Dry pasta or rice meal packs. Meats and other proteins Fatty cuts of meat. Ribs. Fried meat. Tomasa Blase. Bologna, salami, and other precooked or cured meats, such as sausages or meat loaves, that are not lean and low in sodium. Fat from the back of a pig (fatback). Bratwurst. Salted nuts and seeds. Canned beans with added salt. Canned or smoked fish. Whole eggs or egg yolks. Chicken or [place] with skin. Dairy Whole or 2% milk, cream, and half-and-half. Whole or full-fat cream cheese. Whole-fat or sweetened yogurt.  Full-fat cheese. Nondairy creamers. Whipped toppings. Processed cheese and cheese spreads. Fats and oils Butter. Stick margarine. Lard. Shortening. Ghee. Bacon fat. Tropical oils, such as coconut, palm kernel, or palm oil. Seasonings and condiments Onion salt, garlic salt, seasoned salt, table salt, and sea salt. Worcestershire sauce. Tartar sauce. Barbecue sauce. Teriyaki sauce. Soy sauce, including reduced-sodium soy sauce. Steak sauce. Canned and packaged gravies. Fish sauce. Oyster sauce. Cocktail sauce. Store-bought horseradish. Ketchup. Mustard. Meat flavorings and tenderizers. Bouillon cubes. Hot sauces. Pre-made or packaged marinades. Pre-made or packaged taco seasonings. Relishes. Regular salad dressings. Other foods Salted popcorn and pretzels. The items listed above may not be all the foods and drinks you should avoid. Talk to a dietitian to learn more. Where to find more information National Heart, Lung, and Blood Institute (NHLBI): BuffaloDryCleaner.gl American Heart Association (AHA): heart.org Academy of Nutrition  and Dietetics: eatright.org National Kidney Foundation (NKF): kidney.org This information is not intended to replace advice given to you by your health care provider. Make sure you discuss any questions you have with your health care provider. Document Revised: 02/14/2022 Document Reviewed: 02/14/2022 Elsevier Patient Education  2024 [employer].

## 2022-11-29 LAB — CBC WITH DIFFERENTIAL/PLATELET
Absolute Lymphocytes: 2465 {cells}/uL (ref 850–3900)
Absolute Monocytes: 656 {cells}/uL (ref 200–950)
Basophils Absolute: 103 {cells}/uL (ref 0–200)
Basophils Relative: 1.3 %
Eosinophils Absolute: 498 {cells}/uL (ref 15–500)
Eosinophils Relative: 6.3 %
HCT: 49.2 % (ref 38.5–50.0)
Hemoglobin: 15.9 g/dL (ref 13.2–17.1)
MCH: 28.4 pg (ref 27.0–33.0)
MCHC: 32.3 g/dL (ref 32.0–36.0)
MCV: 88 fL (ref 80.0–100.0)
MPV: 9.8 fL (ref 7.5–12.5)
Monocytes Relative: 8.3 %
Neutro Abs: 4179 {cells}/uL (ref 1500–7800)
Neutrophils Relative %: 52.9 %
Platelets: 337 10*3/uL (ref 140–400)
RBC: 5.59 10*6/uL (ref 4.20–5.80)
RDW: 13.1 % (ref 11.0–15.0)
Total Lymphocyte: 31.2 %
WBC: 7.9 10*3/uL (ref 3.8–10.8)

## 2022-11-29 LAB — QUANTIFERON-TB GOLD PLUS
Mitogen-NIL: 6.88 [IU]/mL
NIL: 0.01 [IU]/mL
QuantiFERON-TB Gold Plus: NEGATIVE
TB1-NIL: 0 [IU]/mL
TB2-NIL: 0.01 [IU]/mL

## 2022-12-02 NOTE — Progress Notes (Signed)
TB Gold negative

## 2022-12-04 ENCOUNTER — Other Ambulatory Visit: Payer: Self-pay | Admitting: *Deleted

## 2022-12-04 DIAGNOSIS — L409 Psoriasis, unspecified: Secondary | ICD-10-CM

## 2022-12-04 DIAGNOSIS — L4050 Arthropathic psoriasis, unspecified: Secondary | ICD-10-CM

## 2022-12-04 DIAGNOSIS — Z79899 Other long term (current) drug therapy: Secondary | ICD-10-CM

## 2022-12-04 MED ORDER — TALTZ 80 MG/ML ~~LOC~~ SOAJ: 80.0000 mg | SUBCUTANEOUS | 2 refills | Status: DC

## 2022-12-04 NOTE — Telephone Encounter (Signed)
Refill request received via fax from [facility] for Taltz   Last Fill: 06/18/2022  Labs: 11/27/2022 CBC WNL October 29, 2022 CMP glucose 101, creatinine 0.86, GFR 112, AST 27, ALT 44   TB Gold: 11/27/2022 Neg    Next Visit: 04/30/2023  Last Visit: 11/27/2022  [id number] arthritis   Current Dose per office note 11/27/2022: Taltz 80 mg every 28 days   Okay to refill Taltz?

## 2022-12-09 ENCOUNTER — Telehealth: Payer: Self-pay | Admitting: Pharmacist

## 2022-12-09 NOTE — Telephone Encounter (Signed)
Received notification from [facility] regarding a prior authorization for TALTZ. Authorization has been APPROVED from 11/09/22 to 12/09/23. Approval letter sent to scan center.  Patient must continue to fill through [facility]: [phone]  Authorization # [id number] Phone # [phone]  Chesley Mires, PharmD, MPH, BCPS, CPP Clinical Pharmacist (Rheumatology and Pulmonology)

## 2022-12-09 NOTE — Telephone Encounter (Signed)
Submitted a Prior Authorization RENEWAL request to [employer] for TALTZ via fax. Will update once we receive a response.  Case ID: [id number] Phone: [phone] Fax: [phone]  Chesley Mires, PharmD, MPH, BCPS, CPP Clinical Pharmacist (Rheumatology and Pulmonology)

## 2023-01-22 ENCOUNTER — Ambulatory Visit: Payer: BC Managed Care – PPO | Admitting: Cardiology

## 2023-01-22 NOTE — Progress Notes (Unsigned)
Cardiology Office Note:  .   Date:  01/22/2023  ID:  Tyler Matthews, DOB 07/07/1980, MRN [id number] PCP: Georgianne Fick, MD  [facility] Providers Cardiologist:  None { Click to update primary MD,subspecialty MD or APP then REFRESH:1}  History of Present Illness: .   Tyler Matthews is a 42 y.o. Asian [place] male patient with hypertension, metabolic syndrome with hyperglycemia and hypertriglyceridemia and low HDL, psoriatic arthritis referred to me for evaluation of shortness of breath.   Discussed the use of AI scribe software for clinical note transcription with the patient, who gave verbal consent to proceed.  History of Present Illness            ROS  Labs    Lab Results  Component Value Date   NA 141 03/26/2022   K 4.6 03/26/2022   CO2 29 03/26/2022   GLUCOSE 87 03/26/2022   BUN 19 03/26/2022   CREATININE 0.83 03/26/2022   CALCIUM 10.1 03/26/2022   EGFR 113 03/26/2022   GFRNONAA 110 06/27/2020      Latest Ref Rng & Units 03/26/2022   12:38 PM 11/22/2021    1:40 PM 04/10/2021    4:05 PM  BMP  Glucose 65 - 99 mg/dL 87  253  96   BUN 7 - 25 mg/dL 19  13  10    Creatinine 0.60 - 1.29 mg/dL 6.64  4.03  4.74   BUN/Creat Ratio 6 - 22 (calc) SEE NOTE:  SEE NOTE:  NOT APPLICABLE   Sodium 135 - 146 mmol/L 141  140  140   Potassium 3.5 - 5.3 mmol/L 4.6  4.5  4.5   Chloride 98 - 110 mmol/L 104  103  104   CO2 20 - 32 mmol/L 29  30  28    Calcium 8.6 - 10.3 mg/dL 25.9  9.8  56.3       Latest Ref Rng & Units 11/27/2022    1:50 PM 03/26/2022   12:38 PM 11/22/2021    1:40 PM  CBC  WBC 3.8 - 10.8 Thousand/uL 7.9  7.6  6.6   Hemoglobin 13.2 - 17.1 g/dL 87.5  64.3  32.9   Hematocrit 38.5 - 50.0 % 49.2  46.2  44.4   Platelets 140 - 400 Thousand/uL 337  359  311    External Labs:  Labs 08/19/2022:  Hb 14.5/HCT 44.8, platelet 306, normal indicis.  BUN 16, creatinine 0.98, EGFR 99 mL.  LFTs normal.  A1c 6.1%. TSH normal at 3.3  Total cholesterol 183,  triglycerides 183, HDL 31, LDL 119.  Physical Exam:   VS:  There were no vitals taken for this visit.   Wt Readings from Last 3 Encounters:  11/27/22 176 lb (79.8 kg)  06/05/22 180 lb 3.2 oz (81.7 kg)  03/12/22 178 lb 3.2 oz (80.8 kg)     Physical Exam  Studies Reviewed: .    *** EKG:         *** Medications and allergies    No Known Allergies   Current Outpatient Medications:    amLODipine (NORVASC) 5 MG tablet, Take 5 mg by mouth daily., Disp: , Rfl:    ixekizumab (TALTZ) 80 MG/ML pen, Inject 1 mL (80 mg total) into the skin every 28 (twenty-eight) days., Disp: 1 mL, Rfl: 2   Omega-3 Fatty Acids (OMEGA 3 PO), Take by mouth daily., Disp: , Rfl:    telmisartan (MICARDIS) 40 MG tablet, 1 tablet Orally Once a day for 30 days, Disp: , Rfl:  VITAMIN D PO, Take by mouth daily., Disp: , Rfl:    ASSESSMENT AND PLAN: .      ICD-10-CM   1. Dyspnea on exertion  R06.09     2. Metabolic syndrome  E88.810     3. Primary hypertension  I10       1. Dyspnea on exertion ***  2. Metabolic syndrome ***  3. Primary hypertension ***  Assessment and Plan                {Are you ordering a CV Procedure (e.g. stress test, cath, DCCV, TEE, etc)?   Press F2        :[id number]}   Signed,  Yates Decamp, MD, [facility] 01/22/2023, 6:46 AM [facility] [address] [place], [place] [address] Phone: [phone]. Fax:  [phone]

## 2023-01-23 ENCOUNTER — Encounter: Payer: Self-pay | Admitting: Cardiology

## 2023-01-23 ENCOUNTER — Ambulatory Visit: Payer: BC Managed Care – PPO | Attending: Cardiology | Admitting: Cardiology

## 2023-01-23 VITALS — BP 140/90 | HR 55 | Resp 16 | Ht 69.0 in | Wt 163.0 lb

## 2023-01-23 DIAGNOSIS — I10 Essential (primary) hypertension: Secondary | ICD-10-CM

## 2023-01-23 DIAGNOSIS — R0609 Other forms of dyspnea: Secondary | ICD-10-CM

## 2023-01-23 DIAGNOSIS — E8881 Metabolic syndrome: Secondary | ICD-10-CM | POA: Diagnosis not present

## 2023-01-23 MED ORDER — TELMISARTAN-HCTZ 80-12.5 MG PO TABS: 1.0000 | ORAL_TABLET | ORAL | 2 refills | Status: DC

## 2023-01-23 NOTE — Progress Notes (Addendum)
Cardiology Office Note:  .   Date:  02/04/2023  ID:  Tyler Matthews, DOB [birth year], MRN [id number] PCP: Georgianne Fick, MD  [facility] Providers Cardiologist:  Yates Decamp, MD   History of Present Illness: .   Tyler Matthews is a 42 y.o. Asian [place] male patient with hypertension, metabolic syndrome with hyperglycemia and hypertriglyceridemia and low HDL, psoriatic arthritis referred to me for evaluation of shortness of breath.   Discussed the use of AI scribe software for clinical note transcription with the patient, who gave verbal consent to proceed.  History of Present Illness   The patient, a 42 year old with a history of hypertension and psoriatic arthritis, presents with concerns about high blood pressure and shortness of breath. The patient has been aware of his hypertension for approximately eight years and has been on medication for about six years. Despite medication, the patient reports that his blood pressure often reads in the 140s.  The patient has recently noticed shortness of breath, particularly when jogging. This has led to a decrease in physical activity, which is concerning to the patient as he would like to be able to run with his young children. The patient's psoriatic arthritis has also limited his ability to engage in physical activity, as his doctor has advised against running due to potential harm to his joints.  The patient has recently lost about 10 pounds due to a change in diet, eating only once a day or very small amounts of food. The patient reports that his weight has generally been stable over the years, fluctuating within a narrow range. The patient does not smoke and drinks alcohol only occasionally.       Review of Systems  Cardiovascular:  Positive for dyspnea on exertion. Negative for chest pain and leg swelling.    Labs    Lab Results  Component Value Date   NA 143 02/03/2023   K 4.9 02/03/2023   CO2 24 02/03/2023   GLUCOSE 103 (H)  02/03/2023   BUN 18 02/03/2023   CREATININE 0.96 02/03/2023   CALCIUM 9.9 02/03/2023   EGFR 102 02/03/2023   GFRNONAA 110 06/27/2020      Latest Ref Rng & Units 02/03/2023    8:38 AM 03/26/2022   12:38 PM 11/22/2021    1:40 PM  BMP  Glucose 70 - 99 mg/dL 409  87  811   BUN 6 - 24 mg/dL 18  19  13    Creatinine 0.76 - 1.27 mg/dL 9.14  7.82  9.56   BUN/Creat Ratio 9 - 20 19  SEE NOTE:  SEE NOTE:   Sodium 134 - 144 mmol/L 143  141  140   Potassium 3.5 - 5.2 mmol/L 4.9  4.6  4.5   Chloride 96 - 106 mmol/L 105  104  103   CO2 20 - 29 mmol/L 24  29  30    Calcium 8.7 - 10.2 mg/dL 9.9  21.3  9.8       Latest Ref Rng & Units 11/27/2022    1:50 PM 03/26/2022   12:38 PM 11/22/2021    1:40 PM  CBC  WBC 3.8 - 10.8 Thousand/uL 7.9  7.6  6.6   Hemoglobin 13.2 - 17.1 g/dL 08.6  57.8  46.9   Hematocrit 38.5 - 50.0 % 49.2  46.2  44.4   Platelets 140 - 400 Thousand/uL 337  359  311    External Labs:  Labs 08/19/2022:  Hb 14.5/HCT 44.8, platelet 306, normal indicis.  BUN  16, creatinine 0.98, EGFR 99 mL.  LFTs normal.  A1c 6.1%. TSH normal at 3.3  Total cholesterol 183, triglycerides 183, HDL 31, LDL 119.  Physical Exam:   VS:  BP (!) 140/90 (BP Location: Left Arm, Patient Position: Sitting, Cuff Size: Normal)   Pulse (!) 55   Resp 16   Ht 5\' 9"  (1.753 m)   Wt 163 lb (73.9 kg)   SpO2 98%   BMI 24.07 kg/m    Wt Readings from Last 3 Encounters:  01/23/23 163 lb (73.9 kg)  11/27/22 176 lb (79.8 kg)  06/05/22 180 lb 3.2 oz (81.7 kg)     Physical Exam Neck:     Vascular: No carotid bruit or JVD.  Cardiovascular:     Rate and Rhythm: Normal rate and regular rhythm.     Pulses: Intact distal pulses.     Heart sounds: Normal heart sounds. No murmur heard.    No gallop.  Pulmonary:     Effort: Pulmonary effort is normal.     Breath sounds: Normal breath sounds.  Abdominal:     General: Bowel sounds are normal.     Palpations: Abdomen is soft.  Musculoskeletal:     Right lower  leg: No edema.     Left lower leg: No edema.     Studies Reviewed: .    NA EKG:    EKG Interpretation Date/Time:  Thursday January 23 2023 15:38:46 EST Ventricular Rate:  55 PR Interval:  128 QRS Duration:  94 QT Interval:  424 QTC Calculation: 405 R Axis:   42  Text Interpretation: EKG 01/23/2023: Sinus rhythm with short PR interval, 110 ms.  Incomplete right bundle branch block.  Nonspecific high lateral T wave abnormality. Confirmed by Delrae Rend [phone]) on 01/23/2023 3:45:09 PM    Medications and allergies    No Known Allergies   Current Outpatient Medications:    amLODipine (NORVASC) 5 MG tablet, Take 5 mg by mouth daily., Disp: , Rfl:    ixekizumab (TALTZ) 80 MG/ML pen, Inject 1 mL (80 mg total) into the skin every 28 (twenty-eight) days., Disp: 1 mL, Rfl: 2   Omega-3 Fatty Acids (OMEGA 3 PO), Take by mouth daily., Disp: , Rfl:    telmisartan-hydrochlorothiazide (MICARDIS HCT) 80-12.5 MG tablet, Take 1 tablet by mouth every morning., Disp: 30 tablet, Rfl: 2   VITAMIN D PO, Take by mouth daily., Disp: , Rfl:    ASSESSMENT AND PLAN: .      ICD-10-CM   1. Dyspnea on exertion  R06.09 EKG 12-Lead    Basic Metabolic Panel (BMET)    CT CARDIAC SCORING (SELF PAY ONLY)    EXERCISE TOLERANCE TEST (ETT)    Cardiac Stress Test: Informed Consent Details: Physician/Practitioner Attestation; Transcribe to consent form and obtain patient signature    2. Metabolic syndrome  E88.810 Basic Metabolic Panel (BMET)    Lipid Profile    HgB A1c    CT CARDIAC SCORING (SELF PAY ONLY)    EXERCISE TOLERANCE TEST (ETT)    Cardiac Stress Test: Informed Consent Details: Physician/Practitioner Attestation; Transcribe to consent form and obtain patient signature    3. Primary hypertension  I10 telmisartan-hydrochlorothiazide (MICARDIS HCT) 80-12.5 MG tablet    Basic Metabolic Panel (BMET)    Lipid Profile     Meds ordered this encounter  Medications    telmisartan-hydrochlorothiazide (MICARDIS HCT) 80-12.5 MG tablet    Sig: Take 1 tablet by mouth every morning.    Dispense:  30 tablet  Refill:  2    Medications Discontinued During This Encounter  Medication Reason   telmisartan (MICARDIS) 40 MG tablet Change in therapy    Assessment and Plan    Hypertension Uncontrolled despite current treatment with Amlodipine 5mg  and Telmisartan 40mg . Shortness of breath with exertion, possibly related to uncontrolled hypertension. -Increase Telmisartan to 80mg  daily and add Hydrochlorothiazide. -Check blood pressure in 4-6 weeks. -Echocardiogram  Metabolic Syndrome (hypertension, elevated triglycerides, reduced HDL and prediabetes) Patient has been on a diet for religious reasons and has lost about 10 pounds in weight. Prediabetic with borderline high cholesterol and recent weight loss.  -Encourage further weight loss of at least 5-10 pounds. -Repeat cholesterol and HbA1c in 4 weeks. -Consider starting cholesterol medication if levels remain high in view of high risk of coronary artery disease and [place] population and metabolic syndrome.  Psoriatic Arthritis Managed by Dr. Corliss Skains. -Continue current management.  General Health Maintenance / Followup Plans -Order echocardiogram to assess cardiac function. -Return to clinic in 4-6 weeks to see PA/NP for blood pressure check and lipid management.  If blood pressure is well-controlled, lipids are at goal, he can be sent back to his PCP for further management after I see him back 1 more time in 6 months.  I would have a very low threshold to start him on statin therapy if LDL is >100 in view of his metabolic syndrome. -Return to clinic in 6 months for follow-up with cardiologist.   Signed,  Yates Decamp, MD, [facility] 02/04/2023, 11:52 AM [facility] [address] [place], [place] [address] Phone: [phone]. Fax:  [phone]

## 2023-01-23 NOTE — Patient Instructions (Addendum)
Medication Instructions:  Your physician has recommended you make the following change in your medication Stop telmisartan Start telmisartan hydrochlorothiazide 80/12.5 mg by mouth once daily  *If you need a refill on your cardiac medications before your next appointment, please call your pharmacy*   Lab Work: Have fasting lab work done in about 4 weeks.  BMP, Lipids and A1C.  Lab work can be done at any [facility].  There is an office on the first floor of our building If you have labs (blood work) drawn today and your tests are completely normal, you will receive your results only by: MyChart Message (if you have MyChart) OR A paper copy in the mail If you have any lab test that is abnormal or we need to change your treatment, we will call you to review the results.   Testing/Procedures: none   Follow-Up: At [facility], you and your health needs are our priority.  As part of our continuing mission to provide you with exceptional heart care, we have created designated Provider Care Teams.  These Care Teams include your primary Cardiologist (physician) and Advanced Practice Providers (APPs -  Physician Assistants and Nurse Practitioners) who all work together to provide you with the care you need, when you need it.  We recommend signing up for the patient portal called "MyChart".  Sign up information is provided on this After Visit Summary.  MyChart is used to connect with patients for Virtual Visits (Telemedicine).  Patients are able to view lab/test results, encounter notes, upcoming appointments, etc.  Non-urgent messages can be sent to your provider as well.   To learn more about what you can do with MyChart, go to [email or web address].    Your next appointment:   4-6  week(s)  Provider:   Jari Favre, PA-C, Ronie Spies, PA-C, Robin Searing, NP, Jacolyn Reedy, PA-C, Eligha Bridegroom, NP, Tereso Newcomer, PA-C, or Perlie Gold, PA-C     Then, Yates Decamp, MD will plan to see you  again in 6 month(s).    Other Instructions

## 2023-02-04 ENCOUNTER — Other Ambulatory Visit: Payer: Self-pay | Admitting: Physician Assistant

## 2023-02-04 DIAGNOSIS — L409 Psoriasis, unspecified: Secondary | ICD-10-CM

## 2023-02-04 DIAGNOSIS — L4050 Arthropathic psoriasis, unspecified: Secondary | ICD-10-CM

## 2023-02-04 DIAGNOSIS — Z79899 Other long term (current) drug therapy: Secondary | ICD-10-CM

## 2023-02-04 LAB — LIPID PANEL
Chol/HDL Ratio: 5.4 {ratio} — ABNORMAL HIGH (ref 0.0–5.0)
Cholesterol, Total: 172 mg/dL (ref 100–199)
HDL: 32 mg/dL — ABNORMAL LOW (ref >39)
LDL Chol Calc (NIH): 118 mg/dL — ABNORMAL HIGH (ref 0–99)
Triglycerides: 118 mg/dL (ref 0–149)
VLDL Cholesterol Cal: 22 mg/dL (ref 5–40)

## 2023-02-04 LAB — BASIC METABOLIC PANEL
BUN/Creatinine Ratio: 19 (ref 9–20)
BUN: 18 mg/dL (ref 6–24)
CO2: 24 mmol/L (ref 20–29)
Calcium: 9.9 mg/dL (ref 8.7–10.2)
Chloride: 105 mmol/L (ref 96–106)
Creatinine, Ser: 0.96 mg/dL (ref 0.76–1.27)
Glucose: 103 mg/dL — ABNORMAL HIGH (ref 70–99)
Potassium: 4.9 mmol/L (ref 3.5–5.2)
Sodium: 143 mmol/L (ref 134–144)
eGFR: 102 mL/min/{1.73_m2} (ref >59)

## 2023-02-04 LAB — HEMOGLOBIN A1C
Est. average glucose Bld gHb Est-mCnc: 128 mg/dL
Hgb A1c MFr Bld: 6.1 % — ABNORMAL HIGH (ref 4.8–5.6)

## 2023-02-04 NOTE — Addendum Note (Signed)
Addended by: Delrae Rend on: 02/04/2023 11:52 AM   Modules accepted: Orders

## 2023-02-04 NOTE — Progress Notes (Signed)
Although LDL is not very high, since HDL is low, hyperglycemia is present and hypertension is present condition heritage, would recommend starting him on Lipitor 10 mg daily on his next office visit when he sees NP/PA.  Coronary calcium score is not accurate or assessed in patients <41 years of age however it may still help [place] with further risk stratification.  Will also order a treadmill exercise stress test in view of his dyspnea on exertion.  I have placed orders for treadmill stress test, coronary calcium score as well.  Patient is on MyChart and active, I have sent him the message as well

## 2023-02-06 ENCOUNTER — Other Ambulatory Visit: Payer: Self-pay

## 2023-02-06 NOTE — Telephone Encounter (Signed)
Last Fill: 12/04/2022  Labs: 11/27/2022 CBC WNL, 02/03/2023 Glucose 103  TB Gold: 11/27/2022 Neg    Next Visit: 04/30/2023  Last Visit: 11/27/2022  DX: Psoriatic arthritis   Current Dose per office note 11/27/2022: Taltz 80 mg every 28 days   Okay to refill Taltz?

## 2023-02-14 ENCOUNTER — Other Ambulatory Visit: Payer: Self-pay | Admitting: Cardiology

## 2023-02-14 DIAGNOSIS — I10 Essential (primary) hypertension: Secondary | ICD-10-CM

## 2023-03-04 ENCOUNTER — Encounter: Payer: Self-pay | Admitting: *Deleted

## 2023-03-04 NOTE — Telephone Encounter (Signed)
S/w pt is aware of treadmill test, date and time. Sent pt instruction letter for treadmill through [employer]. Pt is aware of Self pay calcium scoring date and time.  Pt is aware f/u appt was moved due to testing prior with Lebron Conners.

## 2023-03-04 NOTE — Progress Notes (Deleted)
  Cardiology Office Note:  .   Date:  03/04/2023  ID:  Tyler Matthews, DOB [birth year], MRN [id number] PCP: Georgianne Fick, MD  [facility] Providers Cardiologist:  Yates Decamp, MD { Click to update primary MD,subspecialty MD or APP then REFRESH:1}   Patient Profile: .      PMH Hypertension Metabolic syndrome with hyperglycemia and hypertriglyceridemia and low HDL Psoriatic arthritis Pre-diabetes  Referred to cardiology for concerns with elevated BP and shortness of breath. A1C 6.1% on 02/03/23. He has been aware of hypertension for approximately 8 years and has been on medication for about 6. Despite treatment with telmisartan 40 mg daily and amlodipine 5 mg daily, his SBP P is often in the 140s.  He has recently noticed shortness of breath particularly when jogging.  This has led to a decrease in physical activity which is concerning to him as he would like to be able to run with his young children.  He also has psoriatic arthritis which has limited his physical activity as well.  His doctor advised against running due to potential harm to his joints.  He recently lost 10 pounds for religious reasons eating only once a day or very small amounts of food.  He reports weight has generally been stable over the years.  His telmisartan was increased to 80 mg daily and hydrochlorothiazide was added.  Lipid panel revealed total cholesterol 172, triglycerides 118, HDL 32, and LDL 118.  Due to history of hyperglycemia and hypertension and his heritage, since HD L is low he was recommended to start atorvastatin 10 mg daily.        History of Present Illness: .   Tyler Matthews is a *** 43 y.o. male ***   Discussed the use of AI scribe software for clinical note transcription with the patient, who gave verbal consent to proceed.   ROS: ***       Studies Reviewed: .        *** Risk Assessment/Calculations:   {Does this patient have ATRIAL FIBRILLATION?:[phone]} No BP recorded.  {Refresh  Note OR Click here to enter BP  :1}***       Physical Exam:   VS:  There were no vitals taken for this visit.   Wt Readings from Last 3 Encounters:  01/23/23 163 lb (73.9 kg)  11/27/22 176 lb (79.8 kg)  06/05/22 180 lb 3.2 oz (81.7 kg)    GEN: Well nourished, well developed in no acute distress NECK: No JVD; No carotid bruits CARDIAC: ***RRR, no murmurs, rubs, gallops RESPIRATORY:  Clear to auscultation without rales, wheezing or rhonchi  ABDOMEN: Soft, non-tender, non-distended EXTREMITIES:  No edema; No deformity     ASSESSMENT AND PLAN: .   ***    {Are you ordering a CV Procedure (e.g. stress test, cath, DCCV, TEE, etc)?   Press F2        :[id number]}  Dispo: ***  Signed, Eligha Bridegroom, NP-C

## 2023-03-06 ENCOUNTER — Ambulatory Visit: Payer: BC Managed Care – PPO | Admitting: Nurse Practitioner

## 2023-03-12 ENCOUNTER — Ambulatory Visit
Admission: RE | Admit: 2023-03-12 | Discharge: 2023-03-12 | Disposition: A | Discharge status: Home / Self Care | Payer: Self-pay | Source: Ambulatory Visit | Attending: Cardiology | Admitting: Cardiology

## 2023-03-12 ENCOUNTER — Ambulatory Visit (INDEPENDENT_AMBULATORY_CARE_PROVIDER_SITE_OTHER): Payer: BC Managed Care – PPO

## 2023-03-12 DIAGNOSIS — I2510 Atherosclerotic heart disease of native coronary artery without angina pectoris: Secondary | ICD-10-CM | POA: Insufficient documentation

## 2023-03-12 DIAGNOSIS — E8881 Metabolic syndrome: Secondary | ICD-10-CM | POA: Diagnosis not present

## 2023-03-12 DIAGNOSIS — R7309 Other abnormal glucose: Secondary | ICD-10-CM | POA: Insufficient documentation

## 2023-03-12 DIAGNOSIS — R0609 Other forms of dyspnea: Secondary | ICD-10-CM

## 2023-03-12 DIAGNOSIS — I10 Essential (primary) hypertension: Secondary | ICD-10-CM | POA: Insufficient documentation

## 2023-03-12 DIAGNOSIS — E785 Hyperlipidemia, unspecified: Secondary | ICD-10-CM | POA: Insufficient documentation

## 2023-03-12 DIAGNOSIS — Z8249 Family history of ischemic heart disease and other diseases of the circulatory system: Secondary | ICD-10-CM | POA: Insufficient documentation

## 2023-03-12 LAB — EXERCISE TOLERANCE TEST
Angina Index: 0
Base ST Depression (mm): 0 mm
Estimated workload: 11.9
Exercise duration (min): 10 min
Exercise duration (sec): 8 s
MPHR: 179 {beats}/min
Peak HR: 173 {beats}/min
Percent HR: 96 %
RPE: 17
Rest HR: 54 {beats}/min

## 2023-03-12 NOTE — Progress Notes (Unsigned)
Cardiology Office Note:  .   Date:  03/13/2023  ID:  Tyler Matthews, DOB 07-Mar-1980, MRN [id number] PCP: Georgianne Fick, MD   [facility] Providers Cardiologist:  Yates Decamp, MD    Patient Profile: .      PMH Hypertension Metabolic syndrome with hyperglycemia and hypertriglyceridemia and low HDL Psoriatic arthritis Pre-diabetes Coronary artery disease CT Calcium score 03/12/2023 CAC score 17   Referred to cardiology for concerns with elevated BP and shortness of breath. A1C 6.1% on 02/03/23. Seen by Dr. Jacinto Halim on 01/23/23. Aware of hypertension for approximately 8 years and has been on medication for about 6. Despite treatment with telmisartan 40 mg daily and amlodipine 5 mg daily, his SBP  is often in the 140s.  He has recently noticed shortness of breath particularly when jogging.  This has led to a decrease in physical activity which is concerning to him as he would like to be able to run with his young children.  He also has psoriatic arthritis which has limited his physical activity as well.  His doctor advised against running due to potential harm to his joints.  He recently lost 10 pounds for religious reasons eating only once a day or very small amounts of food.  He reports weight has generally been stable over the years.  His telmisartan was increased to 80 mg daily and hydrochlorothiazide was added.  Lipid panel revealed total cholesterol 172, triglycerides 118, HDL 32, and LDL 118.  Due to history of hyperglycemia and hypertension and his heritage, since HDL is low he was recommended to start atorvastatin 10 mg daily.         History of Present Illness: .   Tyler Matthews is a very pleasant 43 y.o. male who is here today for follow-up of hypertension and CAD.  He is concerned about effects of current medications on his kidneys.  He has not been monitoring BP consistently at home. He reports some variation in readings noted at the doctor's office compared to home readings and  higher readings when a machine is used versus manual BP. He participated in  fasting regimen and lost weight, dropping from 176 to 158 pounds. Reports he is active, but is increasing dedicated exercise routine and generally eats a healthy diet. His father experienced kidney failure in the setting of liver cancer and his uncle is on dialysis. His paternal grandfather died from a stroke at a young age and his paternal grandfather also had a stroke.  He is currently not having any concerning cardiac symptoms including no chest pain, shortness of breath, palpitations, orthopnea, PND, edema, presyncope, or syncope. 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   Discussed the use of AI scribe software for clinical note transcription with the patient, who gave verbal consent to proceed.   ROS: See HPI       Studies Reviewed: Marland Kitchen       Exercise treadmill stress test 03/12/2023 Functional status: Normal.  Chest pain: No.  Reason for stopping exercise: Patient request.  Hypertensive response to exercise: No.  Exercise time 10 minutes 08 seconds on Bruce protocol, achieved 11.9 METS, 96% of age-predicted maximum heart rate (APMHR).  Stress ECG negative for ischemia.   Low risk study.  Risk Assessment/Calculations:             Physical Exam:   VS:  BP 122/68   Pulse (!) 53   Ht 5\' 9"  (1.753 m)   Wt 158 lb 6.4 oz (  71.8 kg)   SpO2 99%   BMI 23.39 kg/m    Wt Readings from Last 3 Encounters:  03/13/23 158 lb 6.4 oz (71.8 kg)  01/23/23 163 lb (73.9 kg)  11/27/22 176 lb (79.8 kg)    GEN: Well nourished, well developed in no acute distress NECK: No JVD; No carotid bruits CARDIAC: RRR, no murmurs, rubs, gallops RESPIRATORY:  Clear to auscultation without rales, wheezing or rhonchi  ABDOMEN: Soft, non-tender, non-distended EXTREMITIES:  No edema; No deformity     ASSESSMENT AND PLAN: .    Coronary calcification: CT calcium score 03/12/23 with CAC score of 17 (possibly higher,  checking with reading cardiologist). He denies chest pain, dyspnea, or other symptoms concerning for angina.  No indication for further ischemic evaluation at this time.  Lengthy discussion about the goal LDL 70 or lower. LDL 118, HDL 32 and triglycerides 118 on lipid panel 02/03/23. He has not started atorvastatin which was recommended by primary cardiologist. He is willing to start rosuvastatin 5 mg and continue lifestyle modification. We will recheck lipids along with apolipoprotein a for further risk stratification in 2 to 3 months. Focus on secondary prevention including heart healthy mostly plant based diet avoiding saturated fat, processed foods, simple carbohydrates, and sugar along with aiming for at least 150 minutes of moderate intensity exercise each week.   Hypertension: BP is well controlled today based on 2 separate readings, one of which was done by me. He feels that BP has been well controlled on hydrochlorothiazide to telmisartan and continuation of amlodipine. He is concerned about potential kidney impact. We discussed this in detail.  He would like to consider discontinuing hydrochlorothiazide.  I have asked him to monitor home BP consistently for 2 weeks and report back aiming for goal of 120/80.  We reviewed his renal function over the last 2 years which has been stable.  Hyperlipidemia LDL goal < 70: Lipid panel completed 02/03/2023 with total cholesterol 172, triglycerides 118, HDL 32, LDL-C 118. Was previously advised to start atorvastatin 10 mg but did not start it.  Lengthy discussion about importance of LDL goal 70 or lower.  He is willing to start low-dose rosuvastatin 5 mg daily and have repeat lab testing in 2 to 3 months.  We will additionally get LP(a) for further risk stratification.  Elevated hemoglobin A1C: Discussed the impact of elevated A1c on kidney function and cardiovascular risk. Encourage continuation of dietary modifications and exercise. He plans to discuss   recheck of A1C and urine for proteinuria with PCP.   Family history of cardiovascular disease: Addressed concerns about family history of stroke, heart attack, and kidney disease. Highlighted the importance of risk factor modification, including blood pressure control, cholesterol management, and diabetes control. Continue the current management plan and maintain a healthy lifestyle with diet and exercise.        Disposition: 6 months with Dr. Jacinto Halim  Signed, Eligha Bridegroom, NP-C

## 2023-03-13 ENCOUNTER — Encounter: Payer: Self-pay | Admitting: Nurse Practitioner

## 2023-03-13 ENCOUNTER — Ambulatory Visit: Payer: Self-pay | Admitting: Nurse Practitioner

## 2023-03-13 ENCOUNTER — Encounter: Payer: Self-pay | Admitting: Cardiology

## 2023-03-13 VITALS — BP 122/68 | HR 53 | Ht 69.0 in | Wt 158.4 lb

## 2023-03-13 DIAGNOSIS — R7309 Other abnormal glucose: Secondary | ICD-10-CM | POA: Diagnosis not present

## 2023-03-13 DIAGNOSIS — E785 Hyperlipidemia, unspecified: Secondary | ICD-10-CM | POA: Diagnosis not present

## 2023-03-13 DIAGNOSIS — Z8249 Family history of ischemic heart disease and other diseases of the circulatory system: Secondary | ICD-10-CM

## 2023-03-13 DIAGNOSIS — I2510 Atherosclerotic heart disease of native coronary artery without angina pectoris: Secondary | ICD-10-CM

## 2023-03-13 DIAGNOSIS — I10 Essential (primary) hypertension: Secondary | ICD-10-CM | POA: Diagnosis not present

## 2023-03-13 MED ORDER — ROSUVASTATIN CALCIUM 5 MG PO TABS: 5.0000 mg | ORAL_TABLET | Freq: Every day | ORAL | 11 refills | Status: DC

## 2023-03-13 NOTE — Patient Instructions (Signed)
Medication Instructions:   START Rosuvastatin one (1) tablet by mouth ( 5 mg) daily.   *If you need a refill on your cardiac medications before your next appointment, please call your pharmacy*   Lab Work:  Your physician recommends that you return for a FASTING /NMR/CMET/LPA, fasting after midnight. Any lab corp listed below in 2-3 months. Paper work given to patient today.    If you have labs (blood work) drawn today and your tests are completely normal, you will receive your results only by: MyChart Message (if you have MyChart) OR A paper copy in the mail If you have any lab test that is abnormal or we need to change your treatment, we will call you to review the results.    Testing/Procedures:  None ordered   Follow-Up: At [facility], you and your health needs are our priority.  As part of our continuing mission to provide you with exceptional heart care, we have created designated Provider Care Teams.  These Care Teams include your primary Cardiologist (physician) and Advanced Practice Providers (APPs -  Physician Assistants and Nurse Practitioners) who all work together to provide you with the care you need, when you need it.  We recommend signing up for the patient portal called "MyChart".  Sign up information is provided on this After Visit Summary.  MyChart is used to connect with patients for Virtual Visits (Telemedicine).  Patients are able to view lab/test results, encounter notes, upcoming appointments, etc.  Non-urgent messages can be sent to your provider as well.   To learn more about what you can do with MyChart, go to [email or web address].    Your next appointment:   5 month(s)  Provider:   Yates Decamp, MD     Other Instructions    1st Floor: - Lobby - Registration  - Pharmacy  - Lab - Cafe  2nd Floor: - PV Lab - Diagnostic Testing (echo, CT, nuclear med)  3rd Floor: - Vacant  4th Floor: - TCTS (cardiothoracic surgery) - AFib  Clinic - [facility] - Vascular Surgery  - Vascular Ultrasound  5th Floor: - [facility] Cardiology (general and EP) - Clinical Pharmacy for coumadin, hypertension, lipid, weight-loss medications, and med management appointments    Valet parking services will be available as well.

## 2023-03-13 NOTE — Progress Notes (Signed)
Coronary calcium score 03/13/2023 Total Agatston coronary calcium score 17. MESA database percentile >83. LM: 0 LAD: 0 LCx: 0 RCA: 17 Visualized ascending and descending aorta normal. Extracardiac abnormalities: None.

## 2023-04-18 NOTE — Progress Notes (Deleted)
 Office Visit Note  Patient: Tyler Matthews             Date of Birth: Sep 09, 1980           MRN: [id number]             PCP: Georgianne Fick, MD Referring: Georgianne Fick, MD Visit Date: 04/30/2023 Occupation: @GUAROCC @  Subjective:  No chief complaint on file.   History of Present Illness: Tyler Matthews is a 43 y.o. male ***     Activities of Daily Living:  Patient reports morning stiffness for *** {minute/hour:19697}.   Patient {ACTIONS;DENIES/REPORTS:21021675::"Denies"} nocturnal pain.  Difficulty dressing/grooming: {ACTIONS;DENIES/REPORTS:21021675::"Denies"} Difficulty climbing stairs: {ACTIONS;DENIES/REPORTS:21021675::"Denies"} Difficulty getting out of chair: {ACTIONS;DENIES/REPORTS:21021675::"Denies"} Difficulty using hands for taps, buttons, cutlery, and/or writing: {ACTIONS;DENIES/REPORTS:21021675::"Denies"}  No Rheumatology ROS completed.   PMFS History:  Patient Active Problem List   Diagnosis Date Noted   Essential hypertension 07/27/2021   Family history of psoriasis 06/14/2020   Psoriatic arthritis (HCC) 05/29/2020   Psoriasis 05/29/2020    Past Medical History:  Diagnosis Date   ASCVD (arteriosclerotic cardiovascular disease)    HTN (hypertension)    Prediabetes    Psoriatic arthritis (HCC)     Family History  Problem Relation Age of Onset   Hypertension Mother    Diabetes Father    Parkinson's disease Father    Healthy Brother    Healthy Brother    Hypertension Brother    Healthy Son    Healthy Son    No past surgical history on file. Social History   Social History Narrative   Not on file   Immunization History  Administered Date(s) Administered   Moderna Sars-Covid-2 Vaccination 04/21/2019, 05/21/2019, 02/10/2020     Objective: Vital Signs: There were no vitals taken for this visit.   Physical Exam   Musculoskeletal Exam: ***  CDAI Exam: CDAI Score: -- Patient Global: --; Provider Global: -- Swollen: --; Tender:  -- Joint Exam 04/30/2023   No joint exam has been documented for this visit   There is currently no information documented on the homunculus. Go to the Rheumatology activity and complete the homunculus joint exam.  Investigation: No additional findings.  Imaging: No results found.  Recent Labs: Lab Results  Component Value Date   WBC 7.9 11/27/2022   HGB 15.9 11/27/2022   PLT 337 11/27/2022   NA 143 02/03/2023   K 4.9 02/03/2023   CL 105 02/03/2023   CO2 24 02/03/2023   GLUCOSE 103 (H) 02/03/2023   BUN 18 02/03/2023   CREATININE 0.96 02/03/2023   BILITOT 0.5 03/26/2022   AST 17 03/26/2022   ALT 25 03/26/2022   PROT 7.4 03/26/2022   CALCIUM 9.9 02/03/2023   GFRAA 127 06/27/2020   QFTBGOLDPLUS NEGATIVE 11/27/2022    Speciality Comments: Dxd in [place] 2018, Started MTX 6/wk then decresed to 4/wk. dcd MTX02/22 restarted Tremfya started 09/06/20-04/15/21, Taltz started 06/14/21  Procedures:  No procedures performed Allergies: Patient has no known allergies.   Assessment / Plan:     Visit Diagnoses: No diagnosis found.  Orders: No orders of the defined types were placed in this encounter.  No orders of the defined types were placed in this encounter.   Face-to-face time spent with patient was *** minutes. Greater than 50% of time was spent in counseling and coordination of care.  Follow-Up Instructions: No follow-ups on file.   Ellen Henri, CMA  Note - This record has been created using Animal nutritionist.  Chart creation errors have been  sought, but may not always  have been located. Such creation errors do not reflect on  the standard of medical care.

## 2023-04-30 ENCOUNTER — Other Ambulatory Visit: Payer: Self-pay | Admitting: Rheumatology

## 2023-04-30 ENCOUNTER — Encounter: Payer: Self-pay | Admitting: Rheumatology

## 2023-04-30 ENCOUNTER — Ambulatory Visit: Attending: Rheumatology | Admitting: Rheumatology

## 2023-04-30 ENCOUNTER — Ambulatory Visit: Payer: BC Managed Care – PPO | Admitting: Rheumatology

## 2023-04-30 VITALS — BP 117/82 | HR 66 | Ht 69.0 in | Wt 160.0 lb

## 2023-04-30 DIAGNOSIS — M654 Radial styloid tenosynovitis [de Quervain]: Secondary | ICD-10-CM

## 2023-04-30 DIAGNOSIS — Z79899 Other long term (current) drug therapy: Secondary | ICD-10-CM

## 2023-04-30 DIAGNOSIS — R7303 Prediabetes: Secondary | ICD-10-CM

## 2023-04-30 DIAGNOSIS — L4050 Arthropathic psoriasis, unspecified: Secondary | ICD-10-CM

## 2023-04-30 DIAGNOSIS — I10 Essential (primary) hypertension: Secondary | ICD-10-CM

## 2023-04-30 DIAGNOSIS — E781 Pure hyperglyceridemia: Secondary | ICD-10-CM

## 2023-04-30 DIAGNOSIS — L817 Pigmented purpuric dermatosis: Secondary | ICD-10-CM

## 2023-04-30 DIAGNOSIS — L409 Psoriasis, unspecified: Secondary | ICD-10-CM

## 2023-04-30 MED ORDER — TALTZ 80 MG/ML ~~LOC~~ SOAJ: 80.0000 mg | SUBCUTANEOUS | 0 refills | Status: DC

## 2023-04-30 NOTE — Progress Notes (Signed)
 Office Visit Note  Patient: Tyler Matthews             Date of Birth: Jul 06, 1980           MRN: [id number]             PCP: Georgianne Fick, MD Referring: Georgianne Fick, MD Visit Date: 04/30/2023 Occupation: @GUAROCC @  Subjective:  Medication management  History of Present Illness: Tyler Matthews is a 43 y.o. male with psoriatic arthritis and psoriasis.  He states he ran out of Altamease Oiler about a month ago but he has not experienced any flares.  He denies any increased joint pain and joint swelling.  He has not had any episodes of Achilles tendinitis, plantar fasciitis or uveitis.  He has not had any flares of psoriasis.  He noticed some rash on his forehead.  He also saw Dr. Jacinto Halim who started him on Micardis HCTZ and Crestor 5 mg p.o. daily.  According the patient he has been monitoring his blood pressure at home which has been staying within the normal limits.  He is also made dietary modifications and had intentional weight loss of 20 pounds in the last 3 months.  He is also doing elliptical 30 minutes daily.      Activities of Daily Living:  Patient reports morning stiffness for 0  minute.   Patient Denies nocturnal pain.  Difficulty dressing/grooming: Denies Difficulty climbing stairs: Denies Difficulty getting out of chair: Denies Difficulty using hands for taps, buttons, cutlery, and/or writing: Denies  Review of Systems  Constitutional:  Negative for fatigue.  HENT:  Negative for mouth sores and mouth dryness.   Eyes:  Negative for dryness.  Respiratory:  Negative for shortness of breath.   Cardiovascular:  Negative for chest pain and palpitations.  Gastrointestinal:  Negative for blood in stool, constipation and diarrhea.  Endocrine: Negative for increased urination.  Genitourinary:  Negative for hematuria.  Musculoskeletal:  Negative for joint pain, joint pain, myalgias, morning stiffness and myalgias.  Skin:  Negative for color change, rash and sensitivity to  sunlight.  Allergic/Immunologic: Negative for susceptible to infections.  Neurological:  Negative for dizziness and headaches.  Hematological:  Negative for swollen glands.  Psychiatric/Behavioral:  Negative for depressed mood and sleep disturbance. The patient is not nervous/anxious.     PMFS History:  Patient Active Problem List   Diagnosis Date Noted   Essential hypertension 07/27/2021   Family history of psoriasis 06/14/2020   Psoriatic arthritis (HCC) 05/29/2020   Psoriasis 05/29/2020    Past Medical History:  Diagnosis Date   ASCVD (arteriosclerotic cardiovascular disease)    HTN (hypertension)    Prediabetes    Psoriatic arthritis (HCC)     Family History  Problem Relation Age of Onset   Hypertension Mother    Diabetes Father    Parkinson's disease Father    Healthy Brother    Healthy Brother    Hypertension Brother    Healthy Son    Healthy Son    History reviewed. No pertinent surgical history. Social History   Social History Narrative   Not on file   Immunization History  Administered Date(s) Administered   Moderna Sars-Covid-2 Vaccination 04/21/2019, 05/21/2019, 02/10/2020     Objective: Vital Signs: Ht 5\' 9"  (1.753 m)   Wt 160 lb (72.6 kg)   BMI 23.63 kg/m    Physical Exam Vitals and nursing note reviewed.  Constitutional:      Appearance: He is well-developed.  HENT:     Head:  Normocephalic and atraumatic.  Eyes:     Conjunctiva/sclera: Conjunctivae normal.     Pupils: Pupils are equal, round, and reactive to light.  Cardiovascular:     Rate and Rhythm: Normal rate and regular rhythm.     Heart sounds: Normal heart sounds.  Pulmonary:     Effort: Pulmonary effort is normal.     Breath sounds: Normal breath sounds.  Abdominal:     General: Bowel sounds are normal.     Palpations: Abdomen is soft.  Musculoskeletal:     Cervical back: Normal range of motion and neck supple.  Skin:    General: Skin is warm and dry.     Capillary Refill:  Capillary refill takes less than 2 seconds.  Neurological:     Mental Status: He is alert and oriented to person, place, and time.  Psychiatric:        Behavior: Behavior normal.      Musculoskeletal Exam: Cervical, thoracic and lumbar spine with good range of motion.  Shoulder joints, elbow joints, wrist joints, MCPs PIPs and DIPs were in good range of motion with no synovitis.  Hip joints and knee joints in good range of motion without any warmth swelling or effusion.  There was no tenderness over ankles or MTPs.  CDAI Exam: CDAI Score: -- Patient Global: --; Provider Global: -- Swollen: --; Tender: -- Joint Exam 04/30/2023   No joint exam has been documented for this visit   There is currently no information documented on the homunculus. Go to the Rheumatology activity and complete the homunculus joint exam.  Investigation: No additional findings.  Imaging: No results found.  Recent Labs: Lab Results  Component Value Date   WBC 7.9 11/27/2022   HGB 15.9 11/27/2022   PLT 337 11/27/2022   NA 143 02/03/2023   K 4.9 02/03/2023   CL 105 02/03/2023   CO2 24 02/03/2023   GLUCOSE 103 (H) 02/03/2023   BUN 18 02/03/2023   CREATININE 0.96 02/03/2023   BILITOT 0.5 03/26/2022   AST 17 03/26/2022   ALT 25 03/26/2022   PROT 7.4 03/26/2022   CALCIUM 9.9 02/03/2023   GFRAA 127 06/27/2020   QFTBGOLDPLUS NEGATIVE 11/27/2022    Speciality Comments: Dxd in [place] 2018, Started MTX 6/wk then decresed to 4/wk. dcd MTX02/22 restarted Tremfya started 09/06/20-04/15/21, Taltz started 06/14/21  Procedures:  No procedures performed Allergies: Patient has no known allergies.   Assessment / Plan:     Visit Diagnoses: Psoriatic arthritis (HCC) - dxd 2018 in [place].h/o inflammatory arthritis, Achilles tendinitis, plantar fasciitis.  He has been doing well on Taltz.  He ran out of Altamease Oiler about a month ago and had no flares.  He wants to space Taltz to every 6 weeks.  I was in agreement.  Will get  labs today and then refill Taltz.  Psoriasis-he had no active psoriasis lesions.  High risk medication use - Taltz 80 mg every 28 days, previously on tremfya. -November 27, 2022 CBC was normal.  CMP was normal in February 2024 and BMP in December 2024.  Plan: CBC with Differential/Platelet, COMPLETE METABOLIC PANEL WITH GFR, today and then every 3 months.  Information on immunization was placed in the AVS.  He was advised to hold Taltz if he develops an infection resume after the infection resolves.  De Quervain's tenosynovitis, left - resolved after the injection.  He has some residual hypopigmentation.  Essential hypertension-he was evaluated by Dr. Jacinto Halim and the medications were modified.  He is  doing much better on the combination of Micardis HCT and amlodipine.  He states he has been monitoring his blood pressures at home which has been within normal limits.  He will follow-up with Dr. Jacinto Halim.  Hypertriglyceridemia-he is on rosuvastatin 5 mg p.o. daily.  He is also done dietary modifications and had intentional weight loss of 20 pounds over the last 3 months.  Dietary modifications were discussed at length.  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  Patient was seen by the dermatologist she was in agreement with the diagnosis.  His symptoms should improve with exercise.  Prediabetes-hemoglobin A1c was high on February 03, 2023.  Will recheck hemoglobin A1c today.  Orders: Orders Placed This Encounter  Procedures   CBC with Differential/Platelet   COMPLETE METABOLIC PANEL WITH GFR   Hemoglobin A1c   No orders of the defined types were placed in this encounter.   Face-to-face time spent with patient was 30 minutes. Greater than 50% of time was spent in counseling and coordination of care.  Follow-Up Instructions: Return in about 5 months (around 09/30/2023) for Psoriatic arthritis.   Pollyann Savoy, MD  Note - This record has been created using Animal nutritionist.   Chart creation errors have been sought, but may not always  have been located. Such creation errors do not reflect on  the standard of medical care.

## 2023-04-30 NOTE — Patient Instructions (Signed)
Standing Labs We placed an order today for your standing lab work.   Please have your standing labs drawn in June and every 3 months  Please have your labs drawn 2 weeks prior to your appointment so that the provider can discuss your lab results at your appointment, if possible.  Please note that you may see your imaging and lab results in MyChart before we have reviewed them. We will contact you once all results are reviewed. Please allow our office up to 72 hours to thoroughly review all of the results before contacting the office for clarification of your results.  WALK-IN LAB HOURS  Monday through Thursday from 8:00 am -12:30 pm and 1:00 pm-5:00 pm and Friday from 8:00 am-12:00 pm.  Patients with office visits requiring labs will be seen before walk-in labs.  You may encounter longer than normal wait times. Please allow additional time. Wait times may be shorter on  Monday and Thursday afternoons.  We do not book appointments for walk-in labs. We appreciate your patience and understanding with our staff.   Labs are drawn by [facility]. Please bring your co-pay at the time of your lab draw.  You may receive a bill from [facility] for your lab work.  Please note if you are on Hydroxychloroquine and and an order has been placed for a Hydroxychloroquine level,  you will need to have it drawn 4 hours or more after your last dose.  If you wish to have your labs drawn at another location, please call the office 24 hours in advance so we can fax the orders.  The office is located at [address], [address], [place], [place] [address]   If you have any questions regarding directions or hours of operation,  please call [phone].   As a reminder, please drink plenty of water prior to coming for your lab work. Thanks!  Vaccines You are taking a medication(s) that can suppress your immune system.  The following immunizations are recommended: Flu annually Covid-19  Td/Tdap (tetanus, diphtheria,  pertussis) every 10 years Pneumonia (Prevnar 15 then Pneumovax 23 at least 1 year apart.  Alternatively, can take Prevnar 20 without needing additional dose) Shingrix: 2 doses from 4 weeks to 6 months apart  Please check with your PCP to make sure you are up to date.   If you have signs or symptoms of an infection or start antibiotics: First, call your PCP for workup of your infection. Hold your medication through the infection, until you complete your antibiotics, and until symptoms resolve if you take the following: Injectable medication (Actemra, Benlysta, Cimzia, Cosentyx, Enbrel, Humira, Kevzara, Orencia, Remicade, Simponi, Stelara, Taltz, Tremfya) Methotrexate Leflunomide (Arava) Mycophenolate (Cellcept) Xeljanz, Olumiant, or Rinvoq  

## 2023-04-30 NOTE — Telephone Encounter (Signed)
 Patient contacted the office to request a medication refill.   1. Name of Medication: Taltz  2. How are you currently taking this medication (dosage and times per day)? 1 injection / every 28 days   3. What pharmacy would you like for that to be sent to? [facility]

## 2023-04-30 NOTE — Telephone Encounter (Signed)
 Last Fill: 02/06/2023  Labs: 11/27/2022 CBC WNL, 02/03/2023 Glucose 103 LMOM labs are due.  TB Gold: 11/27/2022  TB Gold negative.   Next Visit: 05/27/2023  Last Visit: 11/27/2022  [id number] arthritis   Current Dose per office note 11/27/2022: Taltz 80 mg every 28 days   Okay to refill Taltz?

## 2023-05-01 LAB — CBC WITH DIFFERENTIAL/PLATELET
Absolute Lymphocytes: 2146 {cells}/uL (ref 850–3900)
Absolute Monocytes: 756 {cells}/uL (ref 200–950)
Basophils Absolute: 108 {cells}/uL (ref 0–200)
Basophils Relative: 1.5 %
Eosinophils Absolute: 367 {cells}/uL (ref 15–500)
Eosinophils Relative: 5.1 %
HCT: 44 % (ref 38.5–50.0)
Hemoglobin: 14.5 g/dL (ref 13.2–17.1)
MCH: 28.9 pg (ref 27.0–33.0)
MCHC: 33 g/dL (ref 32.0–36.0)
MCV: 87.8 fL (ref 80.0–100.0)
MPV: 10 fL (ref 7.5–12.5)
Monocytes Relative: 10.5 %
Neutro Abs: 3823 {cells}/uL (ref 1500–7800)
Neutrophils Relative %: 53.1 %
Platelets: 315 10*3/uL (ref 140–400)
RBC: 5.01 10*6/uL (ref 4.20–5.80)
RDW: 12.5 % (ref 11.0–15.0)
Total Lymphocyte: 29.8 %
WBC: 7.2 10*3/uL (ref 3.8–10.8)

## 2023-05-01 LAB — COMPLETE METABOLIC PANEL WITH GFR
AG Ratio: 1.8 (calc) (ref 1.0–2.5)
ALT: 14 U/L (ref 9–46)
AST: 15 U/L (ref 10–40)
Albumin: 4.5 g/dL (ref 3.6–5.1)
Alkaline phosphatase (APISO): 72 U/L (ref 36–130)
BUN: 16 mg/dL (ref 7–25)
CO2: 32 mmol/L (ref 20–32)
Calcium: 9.9 mg/dL (ref 8.6–10.3)
Chloride: 101 mmol/L (ref 98–110)
Creat: 1.04 mg/dL (ref 0.60–1.29)
Globulin: 2.5 g/dL (ref 1.9–3.7)
Glucose, Bld: 83 mg/dL (ref 65–99)
Potassium: 3.9 mmol/L (ref 3.5–5.3)
Sodium: 139 mmol/L (ref 135–146)
Total Bilirubin: 0.7 mg/dL (ref 0.2–1.2)
Total Protein: 7 g/dL (ref 6.1–8.1)

## 2023-05-01 LAB — HEMOGLOBIN A1C
Hgb A1c MFr Bld: 5.7 %{Hb} — ABNORMAL HIGH (ref <5.7)
Mean Plasma Glucose: 117 mg/dL
eAG (mmol/L): 6.5 mmol/L

## 2023-05-01 NOTE — Progress Notes (Signed)
 Hemoglobin A1c is better at 5.7 CBC and CMP are normal.

## 2023-05-22 ENCOUNTER — Other Ambulatory Visit: Payer: Self-pay | Admitting: Physician Assistant

## 2023-05-22 DIAGNOSIS — Z79899 Other long term (current) drug therapy: Secondary | ICD-10-CM

## 2023-05-22 DIAGNOSIS — L4050 Arthropathic psoriasis, unspecified: Secondary | ICD-10-CM

## 2023-05-22 DIAGNOSIS — L409 Psoriasis, unspecified: Secondary | ICD-10-CM

## 2023-05-22 NOTE — Telephone Encounter (Signed)
 Last Fill: 04/30/2023 (30 day supply)  Labs: 04/30/2023 Hemoglobin A1c is better at 5.7 CBC and CMP are normal.   TB Gold: 11/27/2022   Next Visit: 10/02/2023  Last Visit: 04/30/2022  ZO:[id number] arthritis   Current Dose per office note 04/30/2023: Taltz 80 mg every 28 days   Okay to refill Taltz?

## 2023-05-27 ENCOUNTER — Ambulatory Visit: Admitting: Rheumatology

## 2023-07-24 NOTE — Progress Notes (Signed)
 Cardiology Office Note:  .   Date:  07/25/2023  ID:  Tyler Matthews, DOB Apr 20, 1980, MRN [id number] PCP: Virgle Grime, MD  [facility] Providers Cardiologist:  Knox Perl, MD   History of Present Illness: .   Tyler Matthews is a 43 y.o. . Asian [place] male patient with hypertension, metabolic syndrome with hyperglycemia and hypertriglyceridemia and low HDL, psoriatic arthritis presents for a 54-month office visit for dyspnea.  He underwent exercise tolerance test in which he was able to achieve 11.9 METS with excellent exercise tolerance without evidence of ischemia and coronary calcium  score of 34 in the 84th percentile and recommended lifestyle modification and startied on present statin 5 mg he now presents for 33-month office visit.  Tolerating all his medications well.  Discussed the use of AI scribe software for clinical note transcription with the patient, who gave verbal consent to proceed.  History of Present Illness   Tyler Matthews is a 43 year old male with hypertension who presents for a cardiovascular follow-up.  His blood pressure is well-controlled with telmisartan  and hydrochlorothiazide, consistently ranging from 110-118/72 mmHg. He is taking rosuvastatin  for cholesterol management without side effects. He expresses concern about the long-term impact of telmisartan  and hydrochlorothiazide on kidney function, specifically creatinine levels.  He follows a diet with limited traditional rice intake and engages in regular physical activity, including brisk walking. He has no current symptoms or issues with medications.      Labs   Lab Results  Component Value Date   CHOL 172 02/03/2023   HDL 32 (L) 02/03/2023   LDLCALC 118 (H) 02/03/2023   TRIG 118 02/03/2023   CHOLHDL 5.4 (H) 02/03/2023   Lab Results  Component Value Date   NA 139 04/30/2023   K 3.9 04/30/2023   CO2 32 04/30/2023   GLUCOSE 83 04/30/2023   BUN 16 04/30/2023   CREATININE 1.04 04/30/2023    CALCIUM  9.9 04/30/2023   EGFR 102 02/03/2023   GFRNONAA 110 06/27/2020      Latest Ref Rng & Units 04/30/2023    2:49 PM 02/03/2023    8:38 AM 03/26/2022   12:38 PM  BMP  Glucose 65 - 99 mg/dL 83  829  87   BUN 7 - 25 mg/dL 16  18  19    Creatinine 0.60 - 1.29 mg/dL 5.62  1.30  8.65   BUN/Creat Ratio 6 - 22 (calc) SEE NOTE:  19  SEE NOTE:   Sodium 135 - 146 mmol/L 139  143  141   Potassium 3.5 - 5.3 mmol/L 3.9  4.9  4.6   Chloride 98 - 110 mmol/L 101  105  104   CO2 20 - 32 mmol/L 32  24  29   Calcium  8.6 - 10.3 mg/dL 9.9  9.9  78.4       Latest Ref Rng & Units 04/30/2023    2:49 PM 11/27/2022    1:50 PM 03/26/2022   12:38 PM  CBC  WBC 3.8 - 10.8 Thousand/uL 7.2  7.9  7.6   Hemoglobin 13.2 - 17.1 g/dL 69.6  29.5  28.4   Hematocrit 38.5 - 50.0 % 44.0  49.2  46.2   Platelets 140 - 400 Thousand/uL 315  337  359    Lab Results  Component Value Date   HGBA1C 5.7 (H) 04/30/2023    No results found for: TSH   ROS  Review of Systems  Cardiovascular:  Negative for chest pain, dyspnea on exertion and leg swelling.  Physical Exam:   VS:  BP 112/72 (BP Location: Left Arm, Patient Position: Sitting, Cuff Size: Normal)   Pulse 80   Resp 16   Ht 5' 9 (1.753 m)   Wt 164 lb 12.8 oz (74.8 kg)   SpO2 95%   BMI 24.34 kg/m    Wt Readings from Last 3 Encounters:  07/25/23 164 lb 12.8 oz (74.8 kg)  04/30/23 160 lb (72.6 kg)  03/13/23 158 lb 6.4 oz (71.8 kg)    Physical Exam Neck:     Vascular: No carotid bruit or JVD.   Cardiovascular:     Rate and Rhythm: Normal rate and regular rhythm.     Pulses: Intact distal pulses.     Heart sounds: Normal heart sounds. No murmur heard.    No gallop.  Pulmonary:     Effort: Pulmonary effort is normal.     Breath sounds: Normal breath sounds.  Abdominal:     General: Bowel sounds are normal.     Palpations: Abdomen is soft.   Musculoskeletal:     Right lower leg: No edema.     Left lower leg: No edema.    Studies Reviewed: Aaron Aas     Exercise tolerance test 03/12/2023: Exercise time 10 minutes, achieved 11.9 METS, normal blood pressure response, no evidence of ischemia.  Coronary CT scoring 03/12/2023: Total Agatston coronary calcium  score 34. MESA database percentile >87. LM: 0 LAD: 0 LCx: 17 RCA: 17 Visualized ascending and descending aorta normal. Extracardiac abnormalities: None.  EKG:         Medications ordered    No orders of the defined types were placed in this encounter.    ASSESSMENT AND PLAN: .      ICD-10-CM   1. Dyslipidemia, goal LDL below 70  E78.5 Lipid Profile    2. Primary hypertension  I10     3. Elevated hemoglobin A1c  R73.09     4. Family history of cardiovascular disease  Z82.49      Assessment and Plan    Hypertension Hypertension is well-controlled with telmisartan  and hydrochlorothiazide. Blood pressure readings are consistently within normal range (e.g., 110/72 mmHg). He was reassured that these medications protect renal function and can be safely taken long-term without adverse effects on kidney function. - Continue telmisartan  and hydrochlorothiazide as prescribed. - Encourage reduction of excess salt and sugar intake. - Recommend combining walking with short intervals of jogging for exercise.  Hyperlipidemia Hyperlipidemia is managed with rosuvastatin . No reported issues with medication. Plan to check cholesterol levels to establish a baseline and adjust treatment if necessary. He has achieved a high exercise capacity, reaching 11.9 METs, indicating good cardiovascular fitness. - Continue rosuvastatin  as prescribed. - Order lipid panel to check cholesterol levels. - Send cholesterol results to Dr. Gloria Lares for further management.      Will see PRN Signed,  Knox Perl, MD, [facility] 07/25/2023, 8:28 AM [facility] [address] [place], [place] [address] Phone: [phone]. Fax:  [phone]

## 2023-07-25 ENCOUNTER — Encounter: Payer: Self-pay | Admitting: Cardiology

## 2023-07-25 ENCOUNTER — Ambulatory Visit: Payer: BC Managed Care – PPO | Attending: Cardiology | Admitting: Cardiology

## 2023-07-25 VITALS — BP 112/72 | HR 80 | Resp 16 | Ht 69.0 in | Wt 164.8 lb

## 2023-07-25 DIAGNOSIS — E785 Hyperlipidemia, unspecified: Secondary | ICD-10-CM | POA: Diagnosis not present

## 2023-07-25 DIAGNOSIS — Z8249 Family history of ischemic heart disease and other diseases of the circulatory system: Secondary | ICD-10-CM | POA: Diagnosis not present

## 2023-07-25 DIAGNOSIS — I10 Essential (primary) hypertension: Secondary | ICD-10-CM | POA: Diagnosis not present

## 2023-07-25 DIAGNOSIS — R7309 Other abnormal glucose: Secondary | ICD-10-CM | POA: Diagnosis not present

## 2023-07-25 LAB — LIPID PANEL
Chol/HDL Ratio: 3.8 ratio (ref 0.0–5.0)
Cholesterol, Total: 142 mg/dL (ref 100–199)
HDL: 37 mg/dL — ABNORMAL LOW (ref >39)
LDL Chol Calc (NIH): 86 mg/dL (ref 0–99)
Triglycerides: 101 mg/dL (ref 0–149)
VLDL Cholesterol Cal: 19 mg/dL (ref 5–40)

## 2023-07-25 NOTE — Patient Instructions (Signed)
 Medication Instructions:  Your physician recommends that you continue on your current medications as directed. Please refer to the Current Medication list given to you today.  *If you need a refill on your cardiac medications before your next appointment, please call your pharmacy*  Lab Work: Have lipid profile checked at lab on first floor today If you have labs (blood work) drawn today and your tests are completely normal, you will receive your results only by: MyChart Message (if you have MyChart) OR A paper copy in the mail If you have any lab test that is abnormal or we need to change your treatment, we will call you to review the results.  Testing/Procedures: none  Follow-Up: At [facility], you and your health needs are our priority.  As part of our continuing mission to provide you with exceptional heart care, our providers are all part of one team.  This team includes your primary Cardiologist (physician) and Advanced Practice Providers or APPs (Physician Assistants and Nurse Practitioners) who all work together to provide you with the care you need, when you need it.  Your next appointment:   As needed  Provider:   Knox Perl, MD    We recommend signing up for the patient portal called MyChart.  Sign up information is provided on this After Visit Summary.  MyChart is used to connect with patients for Virtual Visits (Telemedicine).  Patients are able to view lab/test results, encounter notes, upcoming appointments, etc.  Non-urgent messages can be sent to your provider as well.   To learn more about what you can do with MyChart, go to [email or web address].   Other Instructions

## 2023-07-27 ENCOUNTER — Ambulatory Visit: Payer: Self-pay | Admitting: Cardiology

## 2023-07-27 MED ORDER — ROSUVASTATIN CALCIUM 10 MG PO TABS: 10.0000 mg | ORAL_TABLET | Freq: Every day | ORAL | 0 refills | Status: DC

## 2023-07-27 NOTE — Progress Notes (Signed)
 Your cholesterol is not where I wanted to be, I would like to have the LDL closer to 70.  You are presently on Crestor  5 mg daily, I will send a Rx for 10 mg, for now you can take 2 tablets of the 5 mg.  Follow-up with your PCP and consider rechecking your lipids in about a year.

## 2023-09-08 ENCOUNTER — Other Ambulatory Visit: Payer: Self-pay | Admitting: Physician Assistant

## 2023-09-08 DIAGNOSIS — L409 Psoriasis, unspecified: Secondary | ICD-10-CM

## 2023-09-08 DIAGNOSIS — Z79899 Other long term (current) drug therapy: Secondary | ICD-10-CM

## 2023-09-08 DIAGNOSIS — L4050 Arthropathic psoriasis, unspecified: Secondary | ICD-10-CM

## 2023-09-08 NOTE — Telephone Encounter (Signed)
 Last Fill: 05/22/2023  Labs: 04/30/2023 Hemoglobin A1c is better at 5.7 CBC and CMP are normal.   Contacted patient to advise that labs are needed to be updated.   TB Gold: 11/27/2022 TB Gold Negative    Next Visit: 10/02/2023  Last Visit: 04/30/2023  DX: Psoriatic arthritis   Current Dose per office note 04/30/2023: Taltz  80 mg every 28 days   Okay to refill Taltz ?

## 2023-09-08 NOTE — Telephone Encounter (Signed)
 error

## 2023-09-08 NOTE — Telephone Encounter (Deleted)
 Last Fill:  Labs: ***  TB Gold: ***   Next Visit: ***  Last Visit: ***  DX:***  Current Dose per office note ***: ***  Okay to refill Taltz ?

## 2023-09-18 NOTE — Progress Notes (Signed)
 Office Visit Note  Patient: Tyler Matthews             Date of Birth: [birth year]           MRN: [id number]             PCP: Verdia Lombard, MD Referring: Verdia Lombard, MD Visit Date: 10/02/2023 Occupation: @GUAROCC @  Subjective:  Medication management  History of Present Illness: Tyler Matthews is a 43 y.o. male with psoriatic arthritis, psoriasis and osteoarthritis.  He returns today after his last visit in March 2025.  He states he has been doing well without any flares of psoriatic arthritis or psoriasis.  He has been taking Taltz  80 mg subcu every 5 weeks.  He has not had any flares.  He denies any history of uveitis, plantar fasciitis or Achilles tendinitis.  He states his blood pressure is much better controlled now and he is followed up by his PCP.  He has been also monitoring his diet and exercising.    Activities of Daily Living:  Patient reports morning stiffness for 0 minutes.   Patient Denies nocturnal pain.  Difficulty dressing/grooming: Denies Difficulty climbing stairs: Denies Difficulty getting out of chair: Denies Difficulty using hands for taps, buttons, cutlery, and/or writing: Denies  Review of Systems  Constitutional:  Negative for fatigue.  HENT:  Negative for mouth sores and mouth dryness.   Eyes:  Negative for dryness.  Respiratory:  Negative for shortness of breath.   Cardiovascular:  Negative for chest pain and palpitations.  Gastrointestinal:  Negative for blood in stool, constipation and diarrhea.  Endocrine: Negative for increased urination.  Genitourinary:  Negative for involuntary urination.  Musculoskeletal:  Negative for joint pain, gait problem, joint pain, joint swelling, myalgias, muscle weakness, morning stiffness, muscle tenderness and myalgias.  Skin:  Negative for color change, rash, hair loss and sensitivity to sunlight.  Allergic/Immunologic: Negative for susceptible to infections.  Neurological:  Negative for dizziness and  headaches.  Hematological:  Negative for swollen glands.  Psychiatric/Behavioral:  Negative for depressed mood and sleep disturbance. The patient is not nervous/anxious.     PMFS History:  Patient Active Problem List   Diagnosis Date Noted   Essential hypertension 07/27/2021   Family history of psoriasis 06/14/2020   Psoriatic arthritis (HCC) 05/29/2020   Psoriasis 05/29/2020    Past Medical History:  Diagnosis Date   ASCVD (arteriosclerotic cardiovascular disease)    HTN (hypertension)    Prediabetes    Psoriatic arthritis (HCC)     Family History  Problem Relation Age of Onset   Hypertension Mother    Diabetes Father    Parkinson's disease Father    Healthy Brother    Healthy Brother    Hypertension Brother    Healthy Son    Healthy Son    History reviewed. No pertinent surgical history. Social History   Social History Narrative   Not on file   Immunization History  Administered Date(s) Administered   Moderna Sars-Covid-2 Vaccination 04/21/2019, 05/21/2019, 02/10/2020     Objective: Vital Signs: BP 125/76 (BP Location: Left Arm, Patient Position: Sitting, Cuff Size: Small)   Pulse 81   Resp 12   Ht 5' 9.5 (1.765 m)   Wt 168 lb (76.2 kg)   BMI 24.45 kg/m    Physical Exam Vitals and nursing note reviewed.  Constitutional:      Appearance: He is well-developed.  HENT:     Head: Normocephalic and atraumatic.  Eyes:     Conjunctiva/sclera:  Conjunctivae normal.     Pupils: Pupils are equal, round, and reactive to light.  Cardiovascular:     Rate and Rhythm: Normal rate and regular rhythm.     Heart sounds: Normal heart sounds.  Pulmonary:     Effort: Pulmonary effort is normal.     Breath sounds: Normal breath sounds.  Abdominal:     General: Bowel sounds are normal.     Palpations: Abdomen is soft.  Musculoskeletal:     Cervical back: Normal range of motion and neck supple.  Skin:    General: Skin is warm and dry.     Capillary Refill: Capillary  refill takes less than 2 seconds.  Neurological:     Mental Status: He is alert and oriented to person, place, and time.  Psychiatric:        Behavior: Behavior normal.      Musculoskeletal Exam: Cervical, thoracic and lumbar spine with good range of motion.  He had no SI joint tenderness.  Shoulders, elbow joints, wrist joints, MCPs PIPs and DIPs with good range of motion with no synovitis.  Hip joints, knee joints, ankles, MTPs and PIPs with good range of motion with no synovitis.  There was no Achilles tendinitis or plantar fasciitis.  CDAI Exam: CDAI Score: -- Patient Global: --; Provider Global: -- Swollen: --; Tender: -- Joint Exam 10/02/2023   No joint exam has been documented for this visit   There is currently no information documented on the homunculus. Go to the Rheumatology activity and complete the homunculus joint exam.  Investigation: No additional findings.  Imaging: No results found.  Recent Labs: Lab Results  Component Value Date   WBC 6.9 09/26/2023   HGB 14.7 09/26/2023   PLT 346 09/26/2023   NA 139 09/26/2023   K 4.6 09/26/2023   CL 103 09/26/2023   CO2 28 09/26/2023   GLUCOSE 92 09/26/2023   BUN 11 09/26/2023   CREATININE 0.93 09/26/2023   BILITOT 0.7 09/26/2023   AST 16 09/26/2023   ALT 19 09/26/2023   PROT 7.2 09/26/2023   CALCIUM  9.7 09/26/2023   GFRAA 127 06/27/2020   QFTBGOLDPLUS NEGATIVE 11/27/2022    Speciality Comments: Dxd in [place]  2018, Started MTX 6/wk then decresed to 4/wk. dcd MTX02/22 restarted Tremfya  started 09/06/20-04/15/21, Taltz  started 06/14/21  Procedures:  No procedures performed Allergies: Patient has no known allergies.   Assessment / Plan:     Visit Diagnoses: Psoriatic arthritis (HCC) - dxd 2018 in [place] .h/o inflammatory arthritis, Achilles tendinitis, plantar fasciitis.  Patient denies having a flare of psoriatic arthritis.  No synovitis or dactylitis was noted.  No Achilles tendinitis, plantar fasciitis or uveitis  was noted.  He has been taking Taltz  subcu injections every 5 weeks which is controlling his symptoms well.  Psoriasis-he denies having any psoriasis lesions.  High risk medication use - Taltz  80 mg every 28 days, previously on tremfya .  CBC and CMP were normal on September 26, 2023.  TB Gold was negative on November 27, 2022.  Will check TB Gold with the next labs.  Information reimmunization was placed in the AVS.  He was advised to hold Taltz  if he develops an infection resume after the infection resolves.  De Quervain's tenosynovitis, left - resolved after the injection.  He had residual hypopigmentation which resolved.  Essential hypertension - he was evaluated by Dr. Ganji and the medications were modified.  He is doing much better on the combination of Micardis  HCT and amlodipine .  His  blood pressure today was 125/76.  Hypertriglyceridemia - rosuvastatin  5 mg p.o. daily.  July 25, 2023 lipid panel LDL 86, HDL was low at 37.  Dietary modifications and exercise was emphasized.  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  Patient was seen by the dermatologist she was in agreement with the diagnosis.  Prediabetes-he is trying to be uncontrolled glycemic index diet.  Orders: No orders of the defined types were placed in this encounter.  No orders of the defined types were placed in this encounter.    Follow-Up Instructions: Return in about 5 months (around 03/03/2024) for Psoriatic arthritis.   Maya Nash, MD  Note - This record has been created using Animal nutritionist.  Chart creation errors have been sought, but may not always  have been located. Such creation errors do not reflect on  the standard of medical care.

## 2023-09-26 ENCOUNTER — Other Ambulatory Visit: Payer: Self-pay | Admitting: *Deleted

## 2023-09-26 DIAGNOSIS — Z79899 Other long term (current) drug therapy: Secondary | ICD-10-CM

## 2023-09-26 DIAGNOSIS — Z111 Encounter for screening for respiratory tuberculosis: Secondary | ICD-10-CM

## 2023-09-26 DIAGNOSIS — Z9225 Personal history of immunosupression therapy: Secondary | ICD-10-CM

## 2023-09-27 ENCOUNTER — Ambulatory Visit: Payer: Self-pay | Admitting: Rheumatology

## 2023-09-27 LAB — CBC WITH DIFFERENTIAL/PLATELET
Absolute Lymphocytes: 1573 {cells}/uL (ref 850–3900)
Absolute Monocytes: 704 {cells}/uL (ref 200–950)
Basophils Absolute: 83 {cells}/uL (ref 0–200)
Basophils Relative: 1.2 %
Eosinophils Absolute: 290 {cells}/uL (ref 15–500)
Eosinophils Relative: 4.2 %
HCT: 44.3 % (ref 38.5–50.0)
Hemoglobin: 14.7 g/dL (ref 13.2–17.1)
MCH: 30.2 pg (ref 27.0–33.0)
MCHC: 33.2 g/dL (ref 32.0–36.0)
MCV: 91 fL (ref 80.0–100.0)
MPV: 9.7 fL (ref 7.5–12.5)
Monocytes Relative: 10.2 %
Neutro Abs: 4250 {cells}/uL (ref 1500–7800)
Neutrophils Relative %: 61.6 %
Platelets: 346 Thousand/uL (ref 140–400)
RBC: 4.87 Million/uL (ref 4.20–5.80)
RDW: 12.9 % (ref 11.0–15.0)
Total Lymphocyte: 22.8 %
WBC: 6.9 Thousand/uL (ref 3.8–10.8)

## 2023-09-27 LAB — COMPREHENSIVE METABOLIC PANEL WITH GFR
AG Ratio: 1.6 (calc) (ref 1.0–2.5)
ALT: 19 U/L (ref 9–46)
AST: 16 U/L (ref 10–40)
Albumin: 4.4 g/dL (ref 3.6–5.1)
Alkaline phosphatase (APISO): 77 U/L (ref 36–130)
BUN: 11 mg/dL (ref 7–25)
CO2: 28 mmol/L (ref 20–32)
Calcium: 9.7 mg/dL (ref 8.6–10.3)
Chloride: 103 mmol/L (ref 98–110)
Creat: 0.93 mg/dL (ref 0.60–1.29)
Globulin: 2.8 g/dL (ref 1.9–3.7)
Glucose, Bld: 92 mg/dL (ref 65–99)
Potassium: 4.6 mmol/L (ref 3.5–5.3)
Sodium: 139 mmol/L (ref 135–146)
Total Bilirubin: 0.7 mg/dL (ref 0.2–1.2)
Total Protein: 7.2 g/dL (ref 6.1–8.1)
eGFR: 105 mL/min/1.73m2 (ref >=60)

## 2023-09-27 NOTE — Progress Notes (Signed)
 CBC and CMP are normal.

## 2023-10-02 ENCOUNTER — Encounter: Payer: Self-pay | Admitting: Rheumatology

## 2023-10-02 ENCOUNTER — Ambulatory Visit: Attending: Rheumatology | Admitting: Rheumatology

## 2023-10-02 VITALS — BP 125/76 | HR 81 | Resp 12 | Ht 69.5 in | Wt 168.0 lb

## 2023-10-02 DIAGNOSIS — L817 Pigmented purpuric dermatosis: Secondary | ICD-10-CM

## 2023-10-02 DIAGNOSIS — L409 Psoriasis, unspecified: Secondary | ICD-10-CM

## 2023-10-02 DIAGNOSIS — L4050 Arthropathic psoriasis, unspecified: Secondary | ICD-10-CM | POA: Diagnosis not present

## 2023-10-02 DIAGNOSIS — M654 Radial styloid tenosynovitis [de Quervain]: Secondary | ICD-10-CM

## 2023-10-02 DIAGNOSIS — E781 Pure hyperglyceridemia: Secondary | ICD-10-CM

## 2023-10-02 DIAGNOSIS — Z79899 Other long term (current) drug therapy: Secondary | ICD-10-CM

## 2023-10-02 DIAGNOSIS — R7303 Prediabetes: Secondary | ICD-10-CM

## 2023-10-02 DIAGNOSIS — I10 Essential (primary) hypertension: Secondary | ICD-10-CM | POA: Diagnosis not present

## 2023-10-02 NOTE — Patient Instructions (Signed)
 Standing Labs We placed an order today for your standing lab work.   Please have your standing labs drawn in November and every 3 months  Please have your labs drawn 2 weeks prior to your appointment so that the provider can discuss your lab results at your appointment, if possible.  Please note that you may see your imaging and lab results in MyChart before we have reviewed them. We will contact you once all results are reviewed. Please allow our office up to 72 hours to thoroughly review all of the results before contacting the office for clarification of your results.  WALK-IN LAB HOURS  Monday through Thursday from 8:00 am -12:30 pm and 1:00 pm-4:30 pm and Friday from 8:00 am-12:00 pm.  Patients with office visits requiring labs will be seen before walk-in labs.  You may encounter longer than normal wait times. Please allow additional time. Wait times may be shorter on  Monday and Thursday afternoons.  We do not book appointments for walk-in labs. We appreciate your patience and understanding with our staff.   Labs are drawn by [facility]. Please bring your co-pay at the time of your lab draw.  You may receive a bill from [facility] for your lab work.  Please note if you are on Hydroxychloroquine and and an order has been placed for a Hydroxychloroquine level,  you will need to have it drawn 4 hours or more after your last dose.  If you wish to have your labs drawn at another location, please call the office 24 hours in advance so we can fax the orders.  The office is located at [address], [address], [place], [place] [address]   If you have any questions regarding directions or hours of operation,  please call [phone].   As a reminder, please drink plenty of water prior to coming for your lab work. Thanks!   Vaccines You are taking a medication(s) that can suppress your immune system.  The following immunizations are recommended: Flu annually Covid-19  Td/Tdap (tetanus,  diphtheria, pertussis) every 10 years Pneumonia (Prevnar 15 then Pneumovax 23  at least 1 year apart.  Alternatively, can take Prevnar 20 without needing additional dose) Shingrix: 2 doses from 4 weeks to 6 months apart  Please check with your PCP to make sure you are up to date.   If you have signs or symptoms of an infection or start antibiotics: First, call your PCP for workup of your infection. Hold your medication through the infection, until you complete your antibiotics, and until symptoms resolve if you take the following: Injectable medication (Actemra, Benlysta, Cimzia, Cosentyx, Enbrel , Humira, Kevzara, Orencia, Remicade, Simponi, Stelara, Taltz, Tremfya) Methotrexate Leflunomide (Arava) Mycophenolate (Cellcept) Earma, Olumiant, or Rinvoq

## 2023-10-06 ENCOUNTER — Other Ambulatory Visit: Payer: Self-pay | Admitting: Physician Assistant

## 2023-10-06 DIAGNOSIS — L409 Psoriasis, unspecified: Secondary | ICD-10-CM

## 2023-10-06 DIAGNOSIS — L4050 Arthropathic psoriasis, unspecified: Secondary | ICD-10-CM

## 2023-10-06 DIAGNOSIS — Z79899 Other long term (current) drug therapy: Secondary | ICD-10-CM

## 2023-10-06 NOTE — Telephone Encounter (Signed)
 Last Fill: 09/08/2023 (30 day supply)  Labs: 09/26/2023 CBC and CMP are normal.   TB Gold: 11/27/2022 Neg    Next Visit: 03/11/2024  Last Visit: 10/02/2023  IK:Ednmpjupr arthritis   Current Dose per office note 10/02/2023: Taltz  80 mg every 28 days   Okay to refill Taltz ?

## 2023-11-26 ENCOUNTER — Telehealth: Payer: Self-pay

## 2023-11-26 NOTE — Telephone Encounter (Signed)
 Received notification from [facility] regarding a prior authorization for TALTZ . Authorization has been APPROVED from 10/27/23 to 11/25/2024. Approval letter sent to scan center.  Authorization # [id number]  Sherry Pennant, PharmD, MPH, BCPS, CPP Clinical Pharmacist [facility])

## 2023-11-26 NOTE — Telephone Encounter (Signed)
 Received faxed PA renewal form from [facility] for Taltz . Completed form and faxed back along with supporting chart notes. Will await determination.  Phone#: [phone] Fax#: [phone]

## 2023-12-15 ENCOUNTER — Other Ambulatory Visit: Payer: Self-pay | Admitting: Physician Assistant

## 2023-12-15 DIAGNOSIS — Z111 Encounter for screening for respiratory tuberculosis: Secondary | ICD-10-CM

## 2023-12-15 DIAGNOSIS — Z79899 Other long term (current) drug therapy: Secondary | ICD-10-CM

## 2023-12-15 DIAGNOSIS — L4050 Arthropathic psoriasis, unspecified: Secondary | ICD-10-CM

## 2023-12-15 DIAGNOSIS — L409 Psoriasis, unspecified: Secondary | ICD-10-CM

## 2023-12-15 NOTE — Telephone Encounter (Signed)
 Last Fill: 10/06/2023  Labs: 09/26/2023 CBC and CMP are normal.   TB Gold: 11/27/2022 TB Gold negative.   Next Visit: 03/11/2024  Last Visit: 10/02/2023  IK:Ednmpjupr arthritis (HCC)   Current Dose per office note 10/02/2023: Taltz  80 mg every 28 days   Contacted the patient and advised he is soon due to update CBC and CMP and he is due to update his TB Gold. Patient states he will come to the office sometime this week or next week to update those. Will place future orders.   Okay to refill Taltz ?

## 2023-12-23 ENCOUNTER — Other Ambulatory Visit: Payer: Self-pay

## 2023-12-23 DIAGNOSIS — Z111 Encounter for screening for respiratory tuberculosis: Secondary | ICD-10-CM

## 2023-12-23 DIAGNOSIS — Z79899 Other long term (current) drug therapy: Secondary | ICD-10-CM

## 2023-12-24 ENCOUNTER — Ambulatory Visit: Payer: Self-pay | Admitting: Physician Assistant

## 2023-12-24 NOTE — Progress Notes (Signed)
 CBC and CMP WNL

## 2023-12-25 LAB — COMPREHENSIVE METABOLIC PANEL WITH GFR
AG Ratio: 1.6 (calc) (ref 1.0–2.5)
ALT: 23 U/L (ref 9–46)
AST: 15 U/L (ref 10–40)
Albumin: 4.4 g/dL (ref 3.6–5.1)
Alkaline phosphatase (APISO): 61 U/L (ref 36–130)
BUN: 12 mg/dL (ref 7–25)
CO2: 30 mmol/L (ref 20–32)
Calcium: 9.8 mg/dL (ref 8.6–10.3)
Chloride: 104 mmol/L (ref 98–110)
Creat: 0.88 mg/dL (ref 0.60–1.29)
Globulin: 2.7 g/dL (ref 1.9–3.7)
Glucose, Bld: 96 mg/dL (ref 65–99)
Potassium: 4.1 mmol/L (ref 3.5–5.3)
Sodium: 141 mmol/L (ref 135–146)
Total Bilirubin: 0.4 mg/dL (ref 0.2–1.2)
Total Protein: 7.1 g/dL (ref 6.1–8.1)
eGFR: 110 mL/min/1.73m2 (ref >=60)

## 2023-12-25 LAB — QUANTIFERON-TB GOLD PLUS
Mitogen-NIL: 6.95 [IU]/mL
NIL: 0.01 [IU]/mL
QuantiFERON-TB Gold Plus: NEGATIVE
TB1-NIL: 0 [IU]/mL
TB2-NIL: 0 [IU]/mL

## 2023-12-25 LAB — CBC WITH DIFFERENTIAL/PLATELET
Absolute Lymphocytes: 2104 {cells}/uL (ref 850–3900)
Absolute Monocytes: 485 {cells}/uL (ref 200–950)
Basophils Absolute: 120 {cells}/uL (ref 0–200)
Basophils Relative: 1.9 %
Eosinophils Absolute: 403 {cells}/uL (ref 15–500)
Eosinophils Relative: 6.4 %
HCT: 45.2 % (ref 38.5–50.0)
Hemoglobin: 14.9 g/dL (ref 13.2–17.1)
MCH: 29.2 pg (ref 27.0–33.0)
MCHC: 33 g/dL (ref 32.0–36.0)
MCV: 88.6 fL (ref 80.0–100.0)
MPV: 10 fL (ref 7.5–12.5)
Monocytes Relative: 7.7 %
Neutro Abs: 3188 {cells}/uL (ref 1500–7800)
Neutrophils Relative %: 50.6 %
Platelets: 316 Thousand/uL (ref 140–400)
RBC: 5.1 Million/uL (ref 4.20–5.80)
RDW: 12.2 % (ref 11.0–15.0)
Total Lymphocyte: 33.4 %
WBC: 6.3 Thousand/uL (ref 3.8–10.8)

## 2023-12-25 NOTE — Progress Notes (Signed)
 TB gold negative

## 2024-01-09 ENCOUNTER — Other Ambulatory Visit: Payer: Self-pay | Admitting: Physician Assistant

## 2024-01-09 DIAGNOSIS — L4050 Arthropathic psoriasis, unspecified: Secondary | ICD-10-CM

## 2024-01-09 DIAGNOSIS — L409 Psoriasis, unspecified: Secondary | ICD-10-CM

## 2024-01-09 DIAGNOSIS — Z79899 Other long term (current) drug therapy: Secondary | ICD-10-CM

## 2024-01-12 NOTE — Telephone Encounter (Signed)
 Last Fill: 12/15/2023  Labs: 12/23/2023 CBC and CMP WNL   TB Gold: 12/23/2023 negative    Next Visit: 03/11/2024  Last Visit: 10/02/2023  IK:Ednmpjupr arthritis   Current Dose per office note on 10/02/2023: Taltz  80 mg every 28 days   Okay to refill Taltz ?

## 2024-02-24 NOTE — Progress Notes (Unsigned)
 "  Office Visit Note  Patient: Tyler Matthews             Date of Birth: Apr 11, 1980           MRN: [id number]             PCP: Verdia Lombard, MD Referring: Verdia Lombard, MD Visit Date: 03/08/2024 Occupation: Data Unavailable  Subjective:  No chief complaint on file.   History of Present Illness: Tyler Matthews is a 44 y.o. male ***     Activities of Daily Living:  Patient reports morning stiffness for *** {minute/hour:19697}.   Patient {ACTIONS;DENIES/REPORTS:21021675::Denies} nocturnal pain.  Difficulty dressing/grooming: {ACTIONS;DENIES/REPORTS:21021675::Denies} Difficulty climbing stairs: {ACTIONS;DENIES/REPORTS:21021675::Denies} Difficulty getting out of chair: {ACTIONS;DENIES/REPORTS:21021675::Denies} Difficulty using hands for taps, buttons, cutlery, and/or writing: {ACTIONS;DENIES/REPORTS:21021675::Denies}  No Rheumatology ROS completed.   PMFS History:  Patient Active Problem List   Diagnosis Date Noted   Essential hypertension 07/27/2021   Family history of psoriasis 06/14/2020   Psoriatic arthritis (HCC) 05/29/2020   Psoriasis 05/29/2020    Past Medical History:  Diagnosis Date   ASCVD (arteriosclerotic cardiovascular disease)    HTN (hypertension)    Prediabetes    Psoriatic arthritis (HCC)     Family History  Problem Relation Age of Onset   Hypertension Mother    Diabetes Father    Parkinson's disease Father    Healthy Brother    Healthy Brother    Hypertension Brother    Healthy Son    Healthy Son    No past surgical history on file. Social History[1] Social History   Social History Narrative   Not on file     Immunization History  Administered Date(s) Administered   Moderna Sars-Covid-2 Vaccination 04/21/2019, 05/21/2019, 02/10/2020     Objective: Vital Signs: There were no vitals taken for this visit.   Physical Exam   Musculoskeletal Exam: ***  CDAI Exam: CDAI Score: -- Patient Global: --;  Provider Global: -- Swollen: --; Tender: -- Joint Exam 03/08/2024   No joint exam has been documented for this visit   There is currently no information documented on the homunculus. Go to the Rheumatology activity and complete the homunculus joint exam.  Investigation: No additional findings.  Imaging: No results found.  Recent Labs: Lab Results  Component Value Date   WBC 6.3 12/23/2023   HGB 14.9 12/23/2023   PLT 316 12/23/2023   NA 141 12/23/2023   K 4.1 12/23/2023   CL 104 12/23/2023   CO2 30 12/23/2023   GLUCOSE 96 12/23/2023   BUN 12 12/23/2023   CREATININE 0.88 12/23/2023   BILITOT 0.4 12/23/2023   AST 15 12/23/2023   ALT 23 12/23/2023   PROT 7.1 12/23/2023   CALCIUM  9.8 12/23/2023   GFRAA 127 06/27/2020   QFTBGOLDPLUS NEGATIVE 12/23/2023    Speciality Comments: Dxd in [place]  2018, Started MTX 6/wk then decresed to 4/wk. dcd MTX02/22 restarted Tremfya  started 09/06/20-04/15/21, Taltz  started 06/14/21  Procedures:  No procedures performed Allergies: Patient has no known allergies.   Assessment / Plan:     Visit Diagnoses: No diagnosis found.  Orders: No orders of the defined types were placed in this encounter.  No orders of the defined types were placed in this encounter.   Face-to-face time spent with patient was *** minutes. Greater than 50% of time was spent in counseling and coordination of care.  Follow-Up Instructions: No follow-ups on file.   Daved JAYSON Gavel, CMA  Note - This record has been created using Animal nutritionist.  Chart creation errors have been sought, but may not always  have been located. Such creation errors do not reflect on  the standard of medical care.     [1] Social History Tobacco Use   Smoking status: Never    Passive exposure: Never   Smokeless tobacco: Never  Vaping Use   Vaping status: Never Used  Substance Use Topics   Alcohol use: Yes    Comment: 2 monthly   Drug use: Never  "

## 2024-02-27 ENCOUNTER — Other Ambulatory Visit: Payer: Self-pay

## 2024-02-27 DIAGNOSIS — I10 Essential (primary) hypertension: Secondary | ICD-10-CM

## 2024-02-27 MED ORDER — TELMISARTAN-HCTZ 80-12.5 MG PO TABS: 1.0000 | ORAL_TABLET | Freq: Every day | ORAL | 3 refills | Status: AC

## 2024-03-08 ENCOUNTER — Ambulatory Visit: Admitting: Rheumatology

## 2024-03-08 DIAGNOSIS — L409 Psoriasis, unspecified: Secondary | ICD-10-CM

## 2024-03-08 DIAGNOSIS — R7303 Prediabetes: Secondary | ICD-10-CM

## 2024-03-08 DIAGNOSIS — I10 Essential (primary) hypertension: Secondary | ICD-10-CM

## 2024-03-08 DIAGNOSIS — Z79899 Other long term (current) drug therapy: Secondary | ICD-10-CM

## 2024-03-08 DIAGNOSIS — L4050 Arthropathic psoriasis, unspecified: Secondary | ICD-10-CM

## 2024-03-08 DIAGNOSIS — M654 Radial styloid tenosynovitis [de Quervain]: Secondary | ICD-10-CM

## 2024-03-08 DIAGNOSIS — L817 Pigmented purpuric dermatosis: Secondary | ICD-10-CM

## 2024-03-08 DIAGNOSIS — E781 Pure hyperglyceridemia: Secondary | ICD-10-CM

## 2024-03-11 ENCOUNTER — Ambulatory Visit: Admitting: Rheumatology

## 2024-03-15 ENCOUNTER — Other Ambulatory Visit: Payer: Self-pay | Admitting: Cardiology

## 2024-03-17 NOTE — Progress Notes (Unsigned)
 "  Office Visit Note  Patient: Tyler Matthews             Date of Birth: August 29, 1980           MRN: [id number]             PCP: Verdia Lombard, MD Referring: Verdia Lombard, MD Visit Date: 03/19/2024 Occupation: Data Unavailable  Subjective:  No chief complaint on file.   History of Present Illness: Toryn Mcclinton is a 44 y.o. male ***     Activities of Daily Living:  Patient reports morning stiffness for *** {minute/hour:19697}.   Patient {ACTIONS;DENIES/REPORTS:21021675::Denies} nocturnal pain.  Difficulty dressing/grooming: {ACTIONS;DENIES/REPORTS:21021675::Denies} Difficulty climbing stairs: {ACTIONS;DENIES/REPORTS:21021675::Denies} Difficulty getting out of chair: {ACTIONS;DENIES/REPORTS:21021675::Denies} Difficulty using hands for taps, buttons, cutlery, and/or writing: {ACTIONS;DENIES/REPORTS:21021675::Denies}  No Rheumatology ROS completed.   PMFS History:  Patient Active Problem List   Diagnosis Date Noted   Essential hypertension 07/27/2021   Family history of psoriasis 06/14/2020   Psoriatic arthritis (HCC) 05/29/2020   Psoriasis 05/29/2020    Past Medical History:  Diagnosis Date   ASCVD (arteriosclerotic cardiovascular disease)    HTN (hypertension)    Prediabetes    Psoriatic arthritis (HCC)     Family History  Problem Relation Age of Onset   Hypertension Mother    Diabetes Father    Parkinson's disease Father    Healthy Brother    Healthy Brother    Hypertension Brother    Healthy Son    Healthy Son    No past surgical history on file. Social History[1] Social History   Social History Narrative   Not on file     Immunization History  Administered Date(s) Administered   Moderna Sars-Covid-2 Vaccination 04/21/2019, 05/21/2019, 02/10/2020     Objective: Vital Signs: There were no vitals taken for this visit.   Physical Exam   Musculoskeletal Exam: ***  CDAI Exam: CDAI Score: -- Patient Global: --; Provider Global:  -- Swollen: --; Tender: -- Joint Exam 03/19/2024   No joint exam has been documented for this visit   There is currently no information documented on the homunculus. Go to the Rheumatology activity and complete the homunculus joint exam.  Investigation: No additional findings.  Imaging: No results found.  Recent Labs: Lab Results  Component Value Date   WBC 6.3 12/23/2023   HGB 14.9 12/23/2023   PLT 316 12/23/2023   NA 141 12/23/2023   K 4.1 12/23/2023   CL 104 12/23/2023   CO2 30 12/23/2023   GLUCOSE 96 12/23/2023   BUN 12 12/23/2023   CREATININE 0.88 12/23/2023   BILITOT 0.4 12/23/2023   AST 15 12/23/2023   ALT 23 12/23/2023   PROT 7.1 12/23/2023   CALCIUM  9.8 12/23/2023   GFRAA 127 06/27/2020   QFTBGOLDPLUS NEGATIVE 12/23/2023    Speciality Comments: Dxd in [place]  2018, Started MTX 6/wk then decresed to 4/wk. dcd MTX02/22 restarted Tremfya  started 09/06/20-04/15/21, Taltz  started 06/14/21  Procedures:  No procedures performed Allergies: Patient has no known allergies.   Assessment / Plan:     Visit Diagnoses: No diagnosis found.  Orders: No orders of the defined types were placed in this encounter.  No orders of the defined types were placed in this encounter.   Face-to-face time spent with patient was *** minutes. Greater than 50% of time was spent in counseling and coordination of care.  Follow-Up Instructions: No follow-ups on file.   Daved JAYSON Gavel, CMA  Note - This record has been created using Animal nutritionist.  Chart creation errors have been sought, but may not always  have been located. Such creation errors do not reflect on  the standard of medical care.      [1]  Social History Tobacco Use   Smoking status: Never    Passive exposure: Never   Smokeless tobacco: Never  Vaping Use   Vaping status: Never Used  Substance Use Topics   Alcohol use: Yes    Comment: 2 monthly   Drug use: Never   "

## 2024-03-18 MED ORDER — ROSUVASTATIN CALCIUM 10 MG PO TABS: 10.0000 mg | ORAL_TABLET | Freq: Every day | ORAL | 0 refills | Status: DC

## 2024-03-18 NOTE — Telephone Encounter (Signed)
 Last lipid panel 07/25/23, when MD started 10 mg and advised to recheck in a year. Refill sent.

## 2024-03-19 ENCOUNTER — Encounter: Payer: Self-pay | Admitting: Rheumatology

## 2024-03-19 ENCOUNTER — Ambulatory Visit: Admitting: Rheumatology

## 2024-03-19 ENCOUNTER — Other Ambulatory Visit: Payer: Self-pay

## 2024-03-19 ENCOUNTER — Other Ambulatory Visit: Payer: Self-pay | Admitting: Cardiology

## 2024-03-19 VITALS — BP 126/83 | HR 58 | Temp 97.7°F | Resp 14 | Ht 70.0 in | Wt 178.8 lb

## 2024-03-19 DIAGNOSIS — I10 Essential (primary) hypertension: Secondary | ICD-10-CM

## 2024-03-19 DIAGNOSIS — E781 Pure hyperglyceridemia: Secondary | ICD-10-CM

## 2024-03-19 DIAGNOSIS — L409 Psoriasis, unspecified: Secondary | ICD-10-CM

## 2024-03-19 DIAGNOSIS — R7303 Prediabetes: Secondary | ICD-10-CM

## 2024-03-19 DIAGNOSIS — L4050 Arthropathic psoriasis, unspecified: Secondary | ICD-10-CM

## 2024-03-19 DIAGNOSIS — Z79899 Other long term (current) drug therapy: Secondary | ICD-10-CM

## 2024-03-19 DIAGNOSIS — M654 Radial styloid tenosynovitis [de Quervain]: Secondary | ICD-10-CM

## 2024-03-19 DIAGNOSIS — L817 Pigmented purpuric dermatosis: Secondary | ICD-10-CM

## 2024-03-19 LAB — CBC WITH DIFFERENTIAL/PLATELET
Absolute Lymphocytes: 2182 {cells}/uL (ref 850–3900)
Absolute Monocytes: 655 {cells}/uL (ref 200–950)
Basophils Absolute: 94 {cells}/uL (ref 0–200)
Basophils Relative: 1.3 %
Eosinophils Absolute: 382 {cells}/uL (ref 15–500)
Eosinophils Relative: 5.3 %
HCT: 46.2 % (ref 39.4–51.1)
Hemoglobin: 15 g/dL (ref 13.2–17.1)
MCH: 29 pg (ref 27.0–33.0)
MCHC: 32.5 g/dL (ref 31.6–35.4)
MCV: 89.2 fL (ref 81.4–101.7)
MPV: 10 fL (ref 7.5–12.5)
Monocytes Relative: 9.1 %
Neutro Abs: 3888 {cells}/uL (ref 1500–7800)
Neutrophils Relative %: 54 %
Platelets: 331 10*3/uL (ref 140–400)
RBC: 5.18 Million/uL (ref 4.20–5.80)
RDW: 12.8 % (ref 11.0–15.0)
Total Lymphocyte: 30.3 %
WBC: 7.2 10*3/uL (ref 3.8–10.8)

## 2024-03-19 MED ORDER — ROSUVASTATIN CALCIUM 10 MG PO TABS: 10.0000 mg | ORAL_TABLET | Freq: Every day | ORAL | 0 refills | Status: AC

## 2024-03-19 MED ORDER — CLOBETASOL PROPIONATE 0.05 % EX SOLN: 1.0000 | Freq: Two times a day (BID) | CUTANEOUS | 0 refills | Status: AC

## 2024-03-19 NOTE — Patient Instructions (Signed)
 Standing Labs We placed an order today for your standing lab work.   Please have your standing labs drawn in May and ever 3 months  Please have your labs drawn 2 weeks prior to your appointment so that the provider can discuss your lab results at your appointment, if possible.  Please note that you may see your imaging and lab results in MyChart before we have reviewed them. We will contact you once all results are reviewed. Please allow our office up to 72 hours to thoroughly review all of the results before contacting the office for clarification of your results.  WALK-IN LAB HOURS  Monday through Thursday from 8:00 am - 4:00 pm and Friday from 8:00 am-12:00 pm.  Patients with office visits requiring labs will be seen before walk-in labs.  You may encounter longer than normal wait times. Please allow additional time. Wait times may be shorter on  Monday and Thursday afternoons.  We do not book appointments for walk-in labs. We appreciate your patience and understanding with our staff.   Labs are drawn by [facility]. Please bring your co-pay at the time of your lab draw.  You may receive a bill from [facility] for your lab work.  Please note if you are on Hydroxychloroquine and and an order has been placed for a Hydroxychloroquine level,  you will need to have it drawn 4 hours or more after your last dose.  If you wish to have your labs drawn at another location, please call the office 24 hours in advance so we can fax the orders.  The office is located at [address], [place], [place] [address]   If you have any questions regarding directions or hours of operation,  please call [phone].   As a reminder, please drink plenty of water prior to coming for your lab work. Thanks!  Vaccines You are taking a medication(s) that can suppress your immune system.  The following immunizations are recommended: Flu annually Covid-19  Td/Tdap (tetanus, diphtheria, pertussis) every 10  years Pneumonia (Prevnar 15 then Pneumovax 23 at least 1 year apart.  Alternatively, can take Prevnar 20 without needing additional dose) Shingrix: 2 doses from 4 weeks to 6 months apart  Please check with your PCP to make sure you are up to date.   If you have signs or symptoms of an infection or start antibiotics: First, call your PCP for workup of your infection. Hold your medication through the infection, until you complete your antibiotics, and until symptoms resolve if you take the following: Injectable medication (Actemra, Benlysta, Cimzia, Cosentyx, Enbrel, Humira, Kevzara, Orencia, Remicade, Simponi, Stelara, Taltz , Tremfya ) Methotrexate  Leflunomide (Arava) Mycophenolate (Cellcept) Xeljanz, Olumiant, or Rinvoq

## 2024-08-16 ENCOUNTER — Ambulatory Visit: Admitting: Rheumatology
